# Patient Record
Sex: Male | Born: 1940
Health system: Southern US, Community
[De-identification: ages and names within clinical notes are randomized; demographics above are authoritative.]

## PROBLEM LIST (undated history)

## (undated) DIAGNOSIS — Z923 Personal history of irradiation: Secondary | ICD-10-CM

## (undated) DIAGNOSIS — N433 Hydrocele, unspecified: Secondary | ICD-10-CM

## (undated) DIAGNOSIS — I771 Stricture of artery: Secondary | ICD-10-CM

## (undated) DIAGNOSIS — IMO0001 Reserved for inherently not codable concepts without codable children: Secondary | ICD-10-CM

## (undated) DIAGNOSIS — K922 Gastrointestinal hemorrhage, unspecified: Secondary | ICD-10-CM

## (undated) DIAGNOSIS — Z9289 Personal history of other medical treatment: Secondary | ICD-10-CM

## (undated) DIAGNOSIS — A048 Other specified bacterial intestinal infections: Secondary | ICD-10-CM

## (undated) DIAGNOSIS — C61 Malignant neoplasm of prostate: Secondary | ICD-10-CM

## (undated) DIAGNOSIS — L98499 Non-pressure chronic ulcer of skin of other sites with unspecified severity: Secondary | ICD-10-CM

## (undated) HISTORY — PX: OTHER SURGICAL HISTORY: SHX169

## (undated) HISTORY — DX: Gastrointestinal hemorrhage, unspecified: K92.2

---

## 2010-05-27 ENCOUNTER — Inpatient Hospital Stay (HOSPITAL_COMMUNITY)
Admission: EM | Admit: 2010-05-27 | Discharge: 2010-06-03 | Payer: Self-pay | Source: Home / Self Care | Attending: Internal Medicine | Admitting: Internal Medicine

## 2010-05-28 ENCOUNTER — Encounter: Payer: Self-pay | Admitting: Internal Medicine

## 2010-05-31 ENCOUNTER — Encounter: Payer: Self-pay | Admitting: Internal Medicine

## 2010-06-03 ENCOUNTER — Encounter: Payer: Self-pay | Admitting: Internal Medicine

## 2010-06-04 ENCOUNTER — Encounter: Payer: Self-pay | Admitting: Nurse Practitioner

## 2010-06-04 ENCOUNTER — Encounter (INDEPENDENT_AMBULATORY_CARE_PROVIDER_SITE_OTHER): Payer: Self-pay | Admitting: *Deleted

## 2010-06-04 ENCOUNTER — Telehealth: Payer: Self-pay | Admitting: Nurse Practitioner

## 2010-06-05 ENCOUNTER — Telehealth (INDEPENDENT_AMBULATORY_CARE_PROVIDER_SITE_OTHER): Payer: Self-pay | Admitting: *Deleted

## 2010-06-06 ENCOUNTER — Encounter (INDEPENDENT_AMBULATORY_CARE_PROVIDER_SITE_OTHER): Payer: Self-pay | Admitting: *Deleted

## 2010-06-30 ENCOUNTER — Ambulatory Visit: Admit: 2010-06-30 | Payer: Self-pay | Admitting: Nurse Practitioner

## 2010-06-30 NOTE — H&P (Signed)
NAMEPARKER, WHERLEY NO.:  [id number]  MEDICAL RECORD NO.:  [id number]          PATIENT TYPE:  INP  LOCATION:  1229                         FACILITY:  [facility]  PHYSICIAN:  Hedwig Morton. Juanda Chance, MD     DATE OF BIRTH:  09-13-1940  DATE OF ADMISSION:  05/27/2010 DATE OF DISCHARGE:                             HISTORY & PHYSICAL   HISTORY:  Mr. Kauk is a 69 year old black male who presented to the Emergency Department May 27, 2010, with a chief complaint of syncope.  He described poor appetite and vomiting over the last few days.  He apparently had a syncopal episode after having a bowel movement.  Syncopal episode was preceded by nausea.  He described crampy abdominal pain and bloody stools.  Of note, the patient had never seen a medical doctor.  He does not take any medications.  In the Emergency Department the patient had a couple of episodes of hypotension with systolic blood pressures in the high 70s to low 80s.  PAST MEDICAL HISTORY:  No known past medical history.  PAST SURGICAL HISTORY:  None.  SOCIAL HISTORY:  No history of drug abuse, nonsmoker.  Does not drink alcohol.  FAMILY MEDICAL HISTORY:  Colon cancer in two brothers.  MEDICATIONS:  None.  ALLERGIES:  NO KNOWN DRUG ALLERGIES.  REVIEW OF SYSTEMS:  Positive for dizziness.  All other review of systems negative other than were noted above.  PHYSICAL EXAMINATION:  VITAL SIGNS:  Blood pressure 139/72, heart rate 98, respirations 16, O2 saturation 99% on room air. HEENT:  Head normocephalic, atraumatic.  No scleral icterus. NECK:  Supple. LUNGS:  Clear to auscultation. CARDIAC:  Tachycardic.  Jugular veins not distended. ABDOMEN:  Distended, nontender.  Active bowel sounds.  No masses felt. EXTREMITIES:  No edema. RECTAL EXAMINATION:  Maroon guaiac-positive stool.  LABORATORY STUDIES:  White count 15.1, hemoglobin 11.5, hematocrit 36.2, MCV 98.8, platelets 211, lipase 28.  Sodium 139,  potassium 3.7, BUN 38, creatinine 1.45, albumin 3.0, LFTs otherwise unremarkable.  INR 1.11. Lactic acid 5.8.  IMAGING:  CT of the head without acute abnormalities.  CT angiogram of the abdomen and pelvis:  The descending abdominal aorta demonstrates scattered atherosclerotic vascular disease.  Major branch vessels widely patent.  Small right renal cyst and small cyst of the left lobe of the liver.  There is high-grade stenosis of the right common iliac artery just distal to the bifurcation of the aorta.  Stenosis estimated at 90% and there is mild post stenotic dilatation of the right common iliac artery.  No dissection is identified.  Right hydrocele.  IMPRESSION: 1. Acute gastrointestinal bleed, lower versus upper source. 2. Family history of colorectal cancer. 3. No primary care physician so no baseline available. 4. 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 Admit for hydration. 2. Follow H and H and transfuse as indicated. 3. Upper endoscopy. 4. Eventual colonoscopy for positive family history of colorectal     cancer. 5. Further recommendations pending above.     Willette Cluster, NP   ______________________________ Hedwig Morton. Juanda Chance, MD    PG/MEDQ  D:  05/29/2010  T:  05/29/2010  Job:  [id number]  Electronically Signed by Willette Cluster NP on 06/18/2010 05:46:08 PM Electronically Signed by Lina Sar MD on 06/30/2010 02:14:42 PM

## 2010-07-01 NOTE — Discharge Summary (Signed)
NAME:  Vincent [name], Vincent [name] NO.:  [id number]  MEDICAL RECORD NO.:  [id number]          PATIENT TYPE:  INP  LOCATION:  1341                         FACILITY:  [facility]  PHYSICIAN:  Krista Som T. Russella Dar, MD, FACGDATE OF BIRTH:  Oct 14, 1940  DATE OF ADMISSION:  05/27/2010 DATE OF DISCHARGE:  06/03/2010                              DISCHARGE SUMMARY  DISPOSITION:  Home in stable condition.  DISCHARGE MEDICATIONS: 1. Pantoprazole 40 mg 1 tablet 30 minutes before breakfast, 1 tablet     30 minutes prior to dinner. 2. Ferrous sulfate 325 mg 1 tablet by mouth twice daily.   DISCHARGE DIAGNOSES: 1. Upper gastrointestinal bleed secondary to pyloric channel ulcer,     status post EGD with control of bleeding. 2. Anemia of acute blood loss, status post 3 units of packed red blood     cells. 3. No other significant past medical history.   CONSULTANTS:  None this admission.  PROCEDURES: 1. EGD with control of bleeding by Dr. Lina Sar, May 28, 2010. 2. Repeat EGD for suspected recurrent upper GI bleed, May 31, 2010.  HOSPITAL COURSE:  Mr. Costabile is a 70 year old male who presented to the emergency department May 27, 2010, with a chief complaint of syncope.  He described poor appetite and vomiting over the last few days.  He described crampy abdominal pain and bloody stools.  The patient was hypotensive on a couple occasions in the emergency department with systolic blood pressures between 70 and 80.  The patient's initial white count was 15.7, hemoglobin 11.5.  BUN elevated at 38, creatinine 1.45.  The patient was admitted to Dr. Verlee Monte Brodie's service, underwent upper endoscopy the following day which revealed multiple ulcers in the pyloric channel.  Control of bleeding of a pyloric channel ulcer was obtained with epinephrine injection.  Upon admission, a CTA of the abdomen and pelvis was obtained and was common iliac artery estimated at 90%.  There was a  right hydrocele.  On the second day of admission, the patient's hemoglobin dropped to 9, but during the EGD there was excessive bleeding, so 2 units of packed red blood cells were ordered and transfused.  Hemoglobin the following day was at 9.3 and the patient was transferred out of the ICU where he remained stable for the next 24 hours.  At 4 a.m. on May 31, 2010, the patient had a melenic stool associated with weakness.  His hemoglobin fell from 9.3 to 7.  On exam, there was a small amount of black/maroon stool in the vault.  The patient was transferred back to step-down where he remained hemodynamically stable.  There was a concern for recurrent GI bleed and the patient was taken back for an upper endoscopy.  No active or recent bleeding was found on EGD.  The pyloric channel ulcer was friable.  Two additional units of packed red blood cells were transfused and hemoglobin rose to 8.4.  The patient was transferred back to the floor the following day, where he remained stable for the remainder of this hospital admission.  On June 03, 2010, the patient felt  fine, he had no recurrent bleeding.  He had been 48 hours out from his upper GI bleed.  Mr. Cueva was discharged home in stable condition on above medications.  He would follow up in our office with myself in approximately 3 weeks. Our staff would notify him of the appointment  time and date.  I believe that an H pylori antibody was supposed to have been drawn, but I do not see any results in the computer.  We can check that at his outpatient appointment.  The patient was not on any NSAIDs prior to admission; he knows not to use any at home for the time being.     Willette Cluster, NP   ______________________________ Venita Lick. Russella Dar, MD, Clementeen Graham    PG/MEDQ  D:  06/03/2010  T:  06/03/2010  Job:  [id number]  Electronically Signed by Willette Cluster NP on 06/18/2010 05:44:37 PM Electronically Signed by Claudette Head MD FACG on  07/01/2010 16:01:09 PM

## 2010-07-03 NOTE — Procedures (Signed)
Summary: Upper Endoscopy  Patient: [name] Note: All result statuses are Final unless otherwise noted.  Tests: (1) Upper Endoscopy (EGD)   EGD Upper Endoscopy       DONE     [facility]     [address] [place], [place]  [address]           ENDOSCOPY PROCEDURE REPORT           PATIENT:  [name], Vincent  MR#:  [id number]     BIRTHDATE:  1940/11/05, 69 yrs. old  GENDER:  male           ENDOSCOPIST:  Hedwig Morton. Juanda Chance, MD     Referred by:           PROCEDURE DATE:  05/31/2010     PROCEDURE:  EGD with biopsy, 43239     ASA CLASS:  Class III     INDICATIONS:  melena Hgb 7.0, s/p UGIB from a pyloric ulcer 3 days     ago, recurrent bleed           MEDICATIONS:   Versed 4 mg, Fentanyl 50 mcg, Benadryl 50 mg     TOPICAL ANESTHETIC:  Cetacaine Spray           DESCRIPTION OF PROCEDURE:   After the risks benefits and     alternatives of the procedure were thoroughly explained, informed     consent was obtained.  The EG-[id number] ([id number]) endoscope was     introduced through the mouth and advanced to the second portion of     the duodenum, without limitations.  The instrument was slowly     withdrawn as the mucosa was fully examined.     <<PROCEDUREIMAGES>>           Multiple ulcers were found. Pyloric channel ulcer with friable     mucosa but no activr bleeding, 2 antral ulcers not bleeding With     standard forceps, a biopsy was obtained and sent to pathology. r/o     H (see image3, image4, image2, and image1).Pylori  Duodenitis was     found (see image6 and image7). deformed duodenal bulb  Otherwise     the examination was normal (see image5). no blood in the stomach     or duodenum    Retroflexed views revealed no abnormalities.    The     scope was then withdrawn from the patient and the procedure     completed.           COMPLICATIONS:  None           ENDOSCOPIC IMPRESSION:     1) Ulcers, multiple     2) Duodenitis     3) Otherwise normal examination     no  active or recent bleeding ( no blood in the stomach or     duodenum), I suspect the pyloric ulcer rebled within last 24 hours           RECOMMENDATIONS:     1) Await pathology results     see chart for orders           REPEAT EXAM:  In 0 year(s) for.           ______________________________     Hedwig Morton. Juanda Chance, MD           CC:           n.     eSIGNED:  Hedwig Morton. Brodie at 05/31/2010 11:52 AM           [place], [id number]  Note: An exclamation mark (!) indicates a result that was not dispersed into the flowsheet. Document Creation Date: 05/31/2010 11:52 AM _______________________________________________________________________  (1) Order result status: Final Collection or observation date-time: 05/31/2010 11:44 Requested date-time:  Receipt date-time:  Reported date-time:  Referring Physician:   Ordering Physician: Lina Sar [phone]) Specimen Source:  Source: Launa Grill Order Number: [id number] Lab site:

## 2010-07-03 NOTE — Letter (Signed)
Summary: New Patient letter  [facility]  [address] [place], [place] [address]   Phone: [phone]  Fax: [phone]       06/06/2010 MRN: [id number]  Vincent [name] [address] [place], [place]  [address]  [place]  Dear Vincent [name],  Welcome to the Gastroenterology Division at [facility].    You are scheduled to see Willette Cluster NP on June 30, 2010 at 9:00am on the 3rd floor at [facility], [address]  We ask that you try to arrive at our office 15 minutes prior to your appointment time to allow for check-in.  We would like you to complete the enclosed self-administered evaluation form prior to your visit and bring it with you on the day of your appointment.  We will review it with you.  Also, please bring a complete list of all your medications or, if you prefer, bring the medication bottles and we will list them.  Please bring your insurance card so that we may make a copy of it.  If your insurance requires a referral to see a specialist, please bring your referral form from your primary care physician.  Co-payments are due at the time of your visit and may be paid by cash, check or credit card.     Your office visit will consist of a consult with your physician (includes a physical exam), any laboratory testing he/she may order, scheduling of any necessary diagnostic testing (e.g. x-ray, ultrasound, CT-scan), and scheduling of a procedure (e.g. Endoscopy, Colonoscopy) if required.  Please allow enough time on your schedule to allow for any/all of these possibilities.    If you cannot keep your appointment, please call [phone] to cancel or reschedule prior to your appointment date.  This allows [place] the opportunity to schedule an appointment for another patient in need of care.  If you do not cancel or reschedule by 5 p.m. the business day prior to your appointment date, you will be charged a $50.00 late cancellation/no-show fee.    Thank you  for choosing [facility] for your medical needs.  We appreciate the opportunity to care for you.  Please visit [place] at our website  to learn more about our practice.                     Sincerely,                                                             The Gastroenterology Division

## 2010-07-03 NOTE — Letter (Signed)
Summary: Patient Notice-Endo Biopsy Results  [facility]  [address] [place], [place] [address]   Phone: [phone]  Fax: [phone]        June 03, 2010 MRN: [id number]    MILIND RAETHER [address] [place], [place]  [address]    Dear Mr. URSIN,  I am pleased to inform you that the biopsies taken during your recent endoscopic examination did not show any evidence of cancer upon pathologic examination.The biopsies from the ulcers show Helicobacter infection.We  will send You medication to treat the infection with 2 antibiotics.  Additional information/recommendations:  __No further action is needed at this time.  Please follow-up with      your primary care physician for your other healthcare needs.  _x_ Please call [phone] to schedule a return visit to review      your condition.  _x_ Continue with the treatment plan as outlined on the day of your      exam.     Please call us if you are having persistent problems or have questions about your condition that have not been fully answered at this time.  Sincerely,  Hart Carwin MD  This letter has been electronically signed by your physician.  Appended Document: Patient Notice-Endo Biopsy Results Letter mailed to patient.
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
## 2010-07-03 NOTE — Procedures (Signed)
Summary: Upper Endoscopy  Patient: Vincent [name] Note: All result statuses are Final unless otherwise noted.  Tests: (1) Upper Endoscopy (EGD)   EGD Upper Endoscopy       DONE     [facility]     [address] [place], [place]  [address]           ENDOSCOPY PROCEDURE REPORT           PATIENT:  Vincent, [name]  MR#:  [id number]     BIRTHDATE:  1940-11-01, 69 yrs. old  GENDER:  male           ENDOSCOPIST:  Hedwig Morton. Juanda Chance, MD     Referred by:           PROCEDURE DATE:  05/28/2010     PROCEDURE:  EGD for control of bleeding     ASA CLASS:  Class III     INDICATIONS:  melena, anemia, abdominal pain Hgb 11.0 dropped to     9.0     ,denies NSAIDs           MEDICATIONS:   Versed 10 mg, Fentanyl 100 mcg, Benadryl 50 mg     TOPICAL ANESTHETIC:  Cetacaine Spray           DESCRIPTION OF PROCEDURE:   After the risks benefits and     alternatives of the procedure were thoroughly explained, informed     consent was obtained.  The  endoscope was introduced through the     mouth and advanced to the second portion of the duodenum, limited     by blood and clots.   The instrument was slowly withdrawn as the     mucosa was fully examined.     <<PROCEDUREIMAGES>>           Multiple ulcers were found pyloric channel epinephrine injection     totalk of 9cc of Epi injected around the pyloric channel blindly     since the ulcer could not be visualized initially due to active     bleeding,, after adequate hemostasis was achieved, the ulcerated     pyloric channel ulcer was identified and additional Epi injected     An ulcer was found in the antrum (see image3). small 5 mm shallow     nonbleeding ulcer in the antrum  Normal esophagus without     inflammation, stricture, mass, varices, or Barrett's epithelium     (see image1).  Normal duodenal folds were noted (see image3).     Retroflexed views revealed no abnormalities.    The scope was then     withdrawn from the patient and the  procedure completed.           COMPLICATIONS:  None           ENDOSCOPIC IMPRESSION:     1) Ulcers, multiple in the pyloric channel     2) Ulcer in the antrum     3) Normal Esophagus WMO     4) Normal duodenal folds     actively bleeding pyloric channel ulcer, s?p hemostasis with     Epi, the lesion not amenable to Endoclip placement, hemostasis     achieved     RECOMMENDATIONS:     see chart for orders           REPEAT EXAM:  In 0 year(s) for.  may re-EGD if recurrent bleeding  ______________________________     Hedwig Morton. Juanda Chance, MD           CC:           n.     eSIGNED:   Hedwig Morton. Stepan Verrette at 05/28/2010 12:32 PM           [place], [id number]  Note: An exclamation mark (!) indicates a result that was not dispersed into the flowsheet. Document Creation Date: 05/28/2010 1:10 PM _______________________________________________________________________  (1) Order result status: Final Collection or observation date-time: 05/28/2010 12:20 Requested date-time:  Receipt date-time:  Reported date-time:  Referring Physician:   Ordering Physician: Lina Sar [phone]) Specimen Source:  Source: Launa Grill Order Number: [id number] Lab site:

## 2010-07-03 NOTE — Miscellaneous (Signed)
Summary: RX Ferrous Sulfate  Clinical Lists Changes  Medications: Added new medication of FERROUS SULFATE 325 (65 FE) MG TABS (FERROUS SULFATE) Take 1 tab twice daily - Signed Added new medication of OMEPRAZOLE 40 MG CPDR (OMEPRAZOLE) Take 1 tab 30 min prior to breakfast and dinner - Signed Rx of FERROUS SULFATE 325 (65 FE) MG TABS (FERROUS SULFATE) Take 1 tab twice daily;  #60 x 1;  Signed;  Entered by: Lowry Ram NCMA;  Authorized by: Willette Cluster NP;  Method used: [facility] [address] [phone]*, [address] [place], [place], [place]  [address], Ph: [phone] or [phone], Fax: [phone] Rx of OMEPRAZOLE 40 MG CPDR (OMEPRAZOLE) Take 1 tab 30 min prior to breakfast and dinner;  #60 x 1;  Signed;  Entered by: Lowry Ram NCMA;  Authorized by: Willette Cluster NP;  Method used: Telephoned to [facility] [address] [phone]*, [address] [place], [place], [place]  [address], Ph: [phone] or [phone], Fax: [phone]    Prescriptions: OMEPRAZOLE 40 MG CPDR (OMEPRAZOLE) Take 1 tab 30 min prior to breakfast and dinner  #60 x 1   Entered by:   Lowry Ram NCMA   Authorized by:   Willette Cluster NP   Signed by:   Lowry Ram NCMA on 06/04/2010   Method used:   Telephoned to ...       [facility]  [address] [phone]* (retail)       [address]       [place], [place]  [address]       Ph: [phone] or [phone]       Fax: [phone]   RxID:   [id number] FERROUS SULFATE 325 (65 FE) MG TABS (FERROUS SULFATE) Take 1 tab twice daily  #60 x 1   Entered by:   Lowry Ram NCMA   Authorized by:   Willette Cluster NP   Signed by:   Lowry Ram NCMA on 06/04/2010   Method used:   Telephoned to ...       [facility]  [address] [phone]* (retail)       [address]       [place], [place]  [address]       Ph: [phone] or [phone]       Fax: [phone]   RxID:    [id number]

## 2010-07-03 NOTE — Miscellaneous (Signed)
  Clinical Lists Changes  Observations: Added new observation of EGD: 05/31/10 (06/04/2010 9:24)
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
## 2010-08-11 LAB — CBC
HCT: 21.1 % — ABNORMAL LOW (ref 39.0–52.0)
HCT: 25.9 % — ABNORMAL LOW (ref 39.0–52.0)
HCT: 27.9 % — ABNORMAL LOW (ref 39.0–52.0)
HCT: 36.2 % — ABNORMAL LOW (ref 39.0–52.0)
Hemoglobin: 11.5 g/dL — ABNORMAL LOW (ref 13.0–17.0)
Hemoglobin: 8.6 g/dL — ABNORMAL LOW (ref 13.0–17.0)
Hemoglobin: 9.3 g/dL — ABNORMAL LOW (ref 13.0–17.0)
MCH: 29.2 pg (ref 26.0–34.0)
MCH: 29.4 pg (ref 26.0–34.0)
MCH: 30.1 pg (ref 26.0–34.0)
MCHC: 31.8 g/dL (ref 30.0–36.0)
MCHC: 33.3 g/dL (ref 30.0–36.0)
MCV: 88.4 fL (ref 78.0–100.0)
MCV: 88.9 fL (ref 78.0–100.0)
MCV: 94.8 fL (ref 78.0–100.0)
Platelets: 115 10*3/uL — ABNORMAL LOW (ref 150–400)
Platelets: 139 10*3/uL — ABNORMAL LOW (ref 150–400)
Platelets: 211 10*3/uL (ref 150–400)
RBC: 2.93 MIL/uL — ABNORMAL LOW (ref 4.22–5.81)
RBC: 3.82 MIL/uL — ABNORMAL LOW (ref 4.22–5.81)
RDW: 14.2 % (ref 11.5–15.5)
RDW: 16.2 % — ABNORMAL HIGH (ref 11.5–15.5)
RDW: 16.4 % — ABNORMAL HIGH (ref 11.5–15.5)
WBC: 12.2 10*3/uL — ABNORMAL HIGH (ref 4.0–10.5)
WBC: 12.4 10*3/uL — ABNORMAL HIGH (ref 4.0–10.5)
WBC: 15.1 10*3/uL — ABNORMAL HIGH (ref 4.0–10.5)

## 2010-08-11 LAB — URINALYSIS, ROUTINE W REFLEX MICROSCOPIC
Bilirubin Urine: NEGATIVE
Glucose, UA: NEGATIVE mg/dL
Hgb urine dipstick: NEGATIVE
Ketones, ur: NEGATIVE mg/dL
Nitrite: NEGATIVE
Protein, ur: NEGATIVE mg/dL
Specific Gravity, Urine: 1.019 (ref 1.005–1.030)
Urobilinogen, UA: 0.2 mg/dL (ref 0.0–1.0)
pH: 5.5 (ref 5.0–8.0)

## 2010-08-11 LAB — COMPREHENSIVE METABOLIC PANEL
ALT: 11 U/L (ref 0–53)
ALT: 12 U/L (ref 0–53)
ALT: 12 U/L (ref 0–53)
ALT: 13 U/L (ref 0–53)
AST: 13 U/L (ref 0–37)
AST: 19 U/L (ref 0–37)
Albumin: 2.3 g/dL — ABNORMAL LOW (ref 3.5–5.2)
Albumin: 2.8 g/dL — ABNORMAL LOW (ref 3.5–5.2)
Albumin: 3 g/dL — ABNORMAL LOW (ref 3.5–5.2)
Alkaline Phosphatase: 29 U/L — ABNORMAL LOW (ref 39–117)
Alkaline Phosphatase: 37 U/L — ABNORMAL LOW (ref 39–117)
Alkaline Phosphatase: 38 U/L — ABNORMAL LOW (ref 39–117)
Alkaline Phosphatase: 45 U/L (ref 39–117)
BUN: 16 mg/dL (ref 6–23)
BUN: 33 mg/dL — ABNORMAL HIGH (ref 6–23)
BUN: 35 mg/dL — ABNORMAL HIGH (ref 6–23)
BUN: 38 mg/dL — ABNORMAL HIGH (ref 6–23)
CO2: 26 mEq/L (ref 19–32)
CO2: 26 mEq/L (ref 19–32)
CO2: 27 mEq/L (ref 19–32)
Calcium: 7.4 mg/dL — ABNORMAL LOW (ref 8.4–10.5)
Calcium: 7.9 mg/dL — ABNORMAL LOW (ref 8.4–10.5)
Calcium: 8.2 mg/dL — ABNORMAL LOW (ref 8.4–10.5)
Chloride: 105 mEq/L (ref 96–112)
Chloride: 109 mEq/L (ref 96–112)
Chloride: 109 mEq/L (ref 96–112)
Creatinine, Ser: 0.96 mg/dL (ref 0.4–1.5)
Creatinine, Ser: 1.45 mg/dL (ref 0.4–1.5)
GFR calc Af Amer: 58 mL/min — ABNORMAL LOW (ref >60)
GFR calc Af Amer: >60 mL/min (ref >60)
GFR calc non Af Amer: 48 mL/min — ABNORMAL LOW (ref >60)
GFR calc non Af Amer: >60 mL/min (ref >60)
GFR calc non Af Amer: >60 mL/min (ref >60)
Glucose, Bld: 113 mg/dL — ABNORMAL HIGH (ref 70–99)
Glucose, Bld: 92 mg/dL (ref 70–99)
Potassium: 3.5 mEq/L (ref 3.5–5.1)
Potassium: 3.7 mEq/L (ref 3.5–5.1)
Potassium: 4.2 mEq/L (ref 3.5–5.1)
Potassium: 4.4 mEq/L (ref 3.5–5.1)
Sodium: 137 mEq/L (ref 135–145)
Sodium: 139 mEq/L (ref 135–145)
Sodium: 140 mEq/L (ref 135–145)
Sodium: 140 mEq/L (ref 135–145)
Total Bilirubin: 0.7 mg/dL (ref 0.3–1.2)
Total Bilirubin: 0.7 mg/dL (ref 0.3–1.2)
Total Bilirubin: 0.8 mg/dL (ref 0.3–1.2)
Total Protein: 4.6 g/dL — ABNORMAL LOW (ref 6.0–8.3)
Total Protein: 4.7 g/dL — ABNORMAL LOW (ref 6.0–8.3)
Total Protein: 5.2 g/dL — ABNORMAL LOW (ref 6.0–8.3)
Total Protein: 5.7 g/dL — ABNORMAL LOW (ref 6.0–8.3)

## 2010-08-11 LAB — CULTURE, BLOOD (ROUTINE X 2)
Culture  Setup Time: 201112291333
Culture  Setup Time: 201112291333
Culture: NO GROWTH
Culture: NO GROWTH

## 2010-08-11 LAB — CROSSMATCH
Antibody Screen: NEGATIVE
Unit division: 0
Unit division: 0

## 2010-08-11 LAB — POCT CARDIAC MARKERS
CKMB, poc: <1 ng/mL — ABNORMAL LOW (ref 1.0–8.0)
Myoglobin, poc: 149 ng/mL (ref 12–200)
Troponin i, poc: <0.05 ng/mL (ref 0.00–0.09)

## 2010-08-11 LAB — RENAL FUNCTION PANEL
BUN: 8 mg/dL (ref 6–23)
CO2: 27 mEq/L (ref 19–32)
Chloride: 108 mEq/L (ref 96–112)
Creatinine, Ser: 1.14 mg/dL (ref 0.4–1.5)

## 2010-08-11 LAB — TYPE AND SCREEN
ABO/RH(D): O POS
Antibody Screen: NEGATIVE
Unit division: 0
Unit division: 0
Unit division: 0

## 2010-08-11 LAB — BASIC METABOLIC PANEL
BUN: 23 mg/dL (ref 6–23)
GFR calc non Af Amer: >60 mL/min (ref >60)
Potassium: 3.9 mEq/L (ref 3.5–5.1)

## 2010-08-11 LAB — HEMOGLOBIN AND HEMATOCRIT, BLOOD
HCT: 25.4 % — ABNORMAL LOW (ref 39.0–52.0)
HCT: 26.6 % — ABNORMAL LOW (ref 39.0–52.0)
HCT: 27.6 % — ABNORMAL LOW (ref 39.0–52.0)
HCT: 27.9 % — ABNORMAL LOW (ref 39.0–52.0)
HCT: 28.3 % — ABNORMAL LOW (ref 39.0–52.0)
HCT: 28.5 % — ABNORMAL LOW (ref 39.0–52.0)
Hemoglobin: 9 g/dL — ABNORMAL LOW (ref 13.0–17.0)
Hemoglobin: 9.1 g/dL — ABNORMAL LOW (ref 13.0–17.0)
Hemoglobin: 9.3 g/dL — ABNORMAL LOW (ref 13.0–17.0)
Hemoglobin: 9.4 g/dL — ABNORMAL LOW (ref 13.0–17.0)
Hemoglobin: 9.6 g/dL — ABNORMAL LOW (ref 13.0–17.0)

## 2010-08-11 LAB — PROTIME-INR
INR: 1.11 (ref 0.00–1.49)
Prothrombin Time: 14.5 seconds (ref 11.6–15.2)

## 2010-08-11 LAB — LACTIC ACID, PLASMA: Lactic Acid, Venous: 5.8 mmol/L — ABNORMAL HIGH (ref 0.5–2.2)

## 2010-08-11 LAB — DIFFERENTIAL
Basophils Absolute: 0 10*3/uL (ref 0.0–0.1)
Basophils Absolute: 0 10*3/uL (ref 0.0–0.1)
Basophils Relative: 0 % (ref 0–1)
Eosinophils Absolute: 0 10*3/uL (ref 0.0–0.7)
Eosinophils Relative: 0 % (ref 0–5)
Lymphocytes Relative: 10 % — ABNORMAL LOW (ref 12–46)
Lymphocytes Relative: 18 % (ref 12–46)
Lymphs Abs: 1.5 10*3/uL (ref 0.7–4.0)
Lymphs Abs: 1.5 10*3/uL (ref 0.7–4.0)
Monocytes Absolute: 0.6 10*3/uL (ref 0.1–1.0)
Monocytes Absolute: 1.1 10*3/uL — ABNORMAL HIGH (ref 0.1–1.0)
Monocytes Relative: 7 % (ref 3–12)
Monocytes Relative: 7 % (ref 3–12)
Neutro Abs: 12.5 10*3/uL — ABNORMAL HIGH (ref 1.7–7.7)
Neutro Abs: 6 10*3/uL (ref 1.7–7.7)
Neutrophils Relative %: 83 % — ABNORMAL HIGH (ref 43–77)

## 2010-08-11 LAB — POCT I-STAT, CHEM 8
BUN: 38 mg/dL — ABNORMAL HIGH (ref 6–23)
Calcium, Ion: 1.11 mmol/L — ABNORMAL LOW (ref 1.12–1.32)
Chloride: 104 mEq/L (ref 96–112)
Creatinine, Ser: 1.5 mg/dL (ref 0.4–1.5)
Glucose, Bld: 116 mg/dL — ABNORMAL HIGH (ref 70–99)
HCT: 38 % — ABNORMAL LOW (ref 39.0–52.0)
Hemoglobin: 12.9 g/dL — ABNORMAL LOW (ref 13.0–17.0)
Potassium: 3.8 mEq/L (ref 3.5–5.1)
Sodium: 140 mEq/L (ref 135–145)
TCO2: 26 mmol/L (ref 0–100)

## 2010-08-11 LAB — HEMOCCULT GUIAC POC 1CARD (OFFICE): Fecal Occult Bld: POSITIVE

## 2010-08-11 LAB — ABO/RH: ABO/RH(D): O POS

## 2010-08-11 LAB — URINE CULTURE
Colony Count: 30000
Culture  Setup Time: 201112280408

## 2010-08-11 LAB — BLOOD GAS, ARTERIAL
Acid-base deficit: 2.2 mmol/L — ABNORMAL HIGH (ref 0.0–2.0)
Bicarbonate: 21.4 mEq/L (ref 20.0–24.0)
Patient temperature: 98.6
TCO2: 19.8 mmol/L (ref 0–100)
pCO2 arterial: 34.4 mmHg — ABNORMAL LOW (ref 35.0–45.0)
pH, Arterial: 7.411 (ref 7.350–7.450)

## 2010-08-11 LAB — LIPASE, BLOOD: Lipase: 28 U/L (ref 11–59)

## 2010-08-11 LAB — OCCULT BLOOD, POC DEVICE: Fecal Occult Bld: POSITIVE

## 2010-08-11 LAB — HEPATIC FUNCTION PANEL
ALT: 12 U/L (ref 0–53)
Alkaline Phosphatase: 35 U/L — ABNORMAL LOW (ref 39–117)
Bilirubin, Direct: 0.2 mg/dL (ref 0.0–0.3)
Indirect Bilirubin: 1.2 mg/dL — ABNORMAL HIGH (ref 0.3–0.9)

## 2010-08-11 LAB — PREPARE RBC (CROSSMATCH)

## 2011-11-20 IMAGING — CT CT HEAD W/O CM
3 of 4 series · 16 of 40 positions shown, 19 images · non-contrast
Comparison: None.

CT HEAD

CLINICAL DATA: Headache and neck pain following a syncopal episode
resulting in a fall.

CT HEAD WITHOUT CONTRAST
CT CERVICAL SPINE WITHOUT CONTRAST
TECHNIQUE: Multidetector CT imaging of the head and cervical spine
was performed following the standard protocol without intravenous
contrast.  Multiplanar CT image reconstructions of the cervical
spine were also generated.

[Series 3: head_seq 4.5 h37s st · axial · 0.43mm/px · z∈[-4,+41]mm · 2 of 32 slices shown]
[im 11/32  brain]
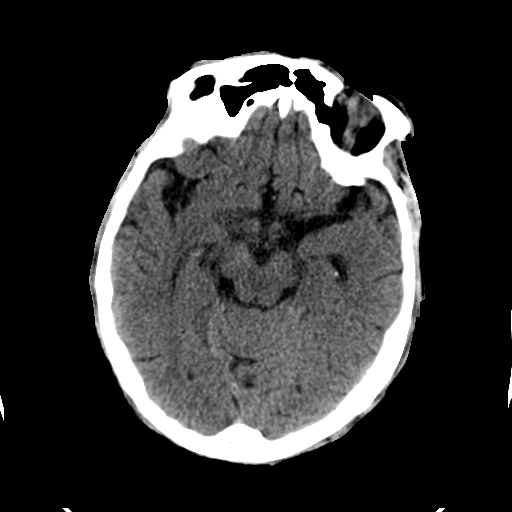
[im 21/32  brain]
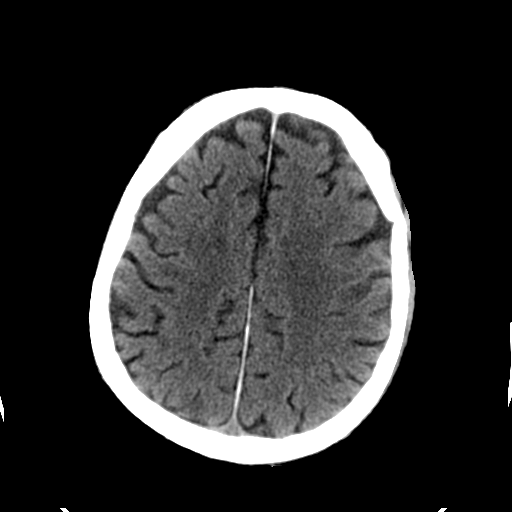

[Series 602: <mpr thick range> · coronal · 0.34mm/px · 3 of 49 slices shown]
[im 17/49  brain]
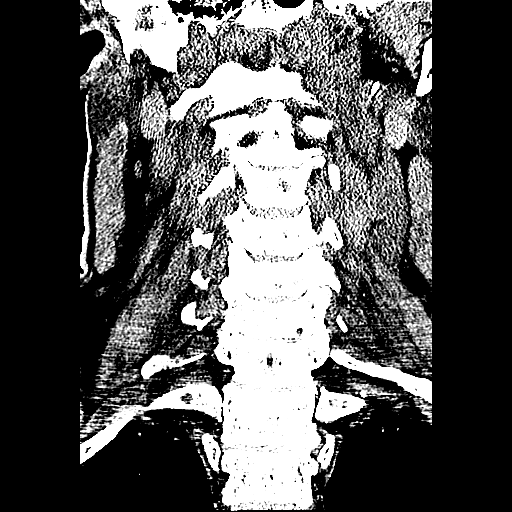
[im 22/49  brain]
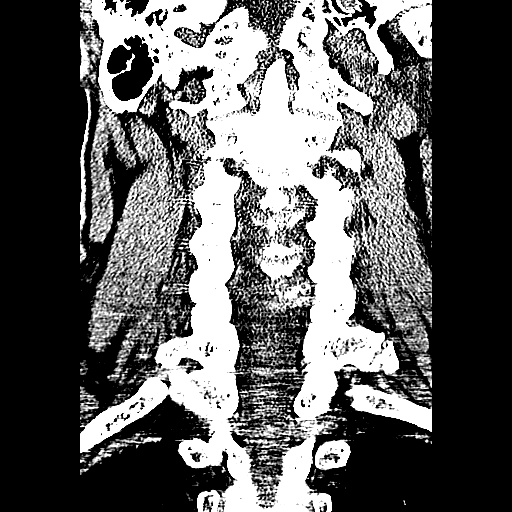
[im 27/49  brain]
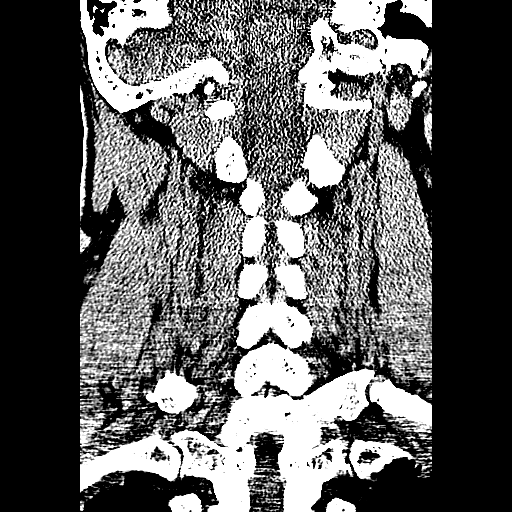

[Series 603: <mpr thick range(1)> · axial · 0.34mm/px · z∈[-228,-89]mm · 11 of 91 slices shown, 14 images]
[im 8/91  brain]
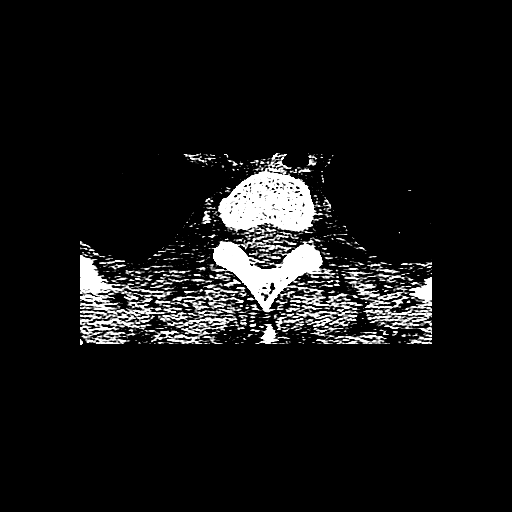
[im 8/91  bone]
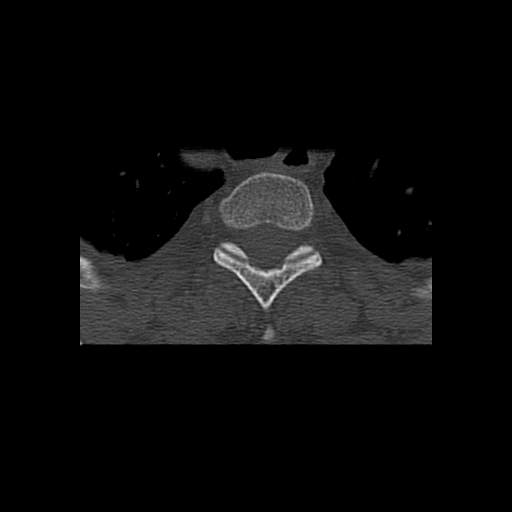
[im 16/91  brain]
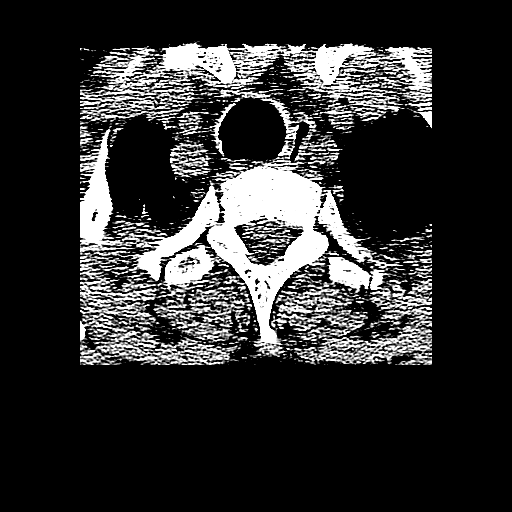
[im 23/91  brain]
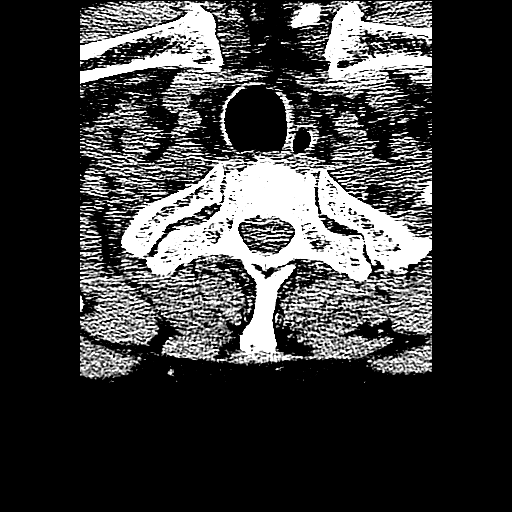
[im 31/91  brain]
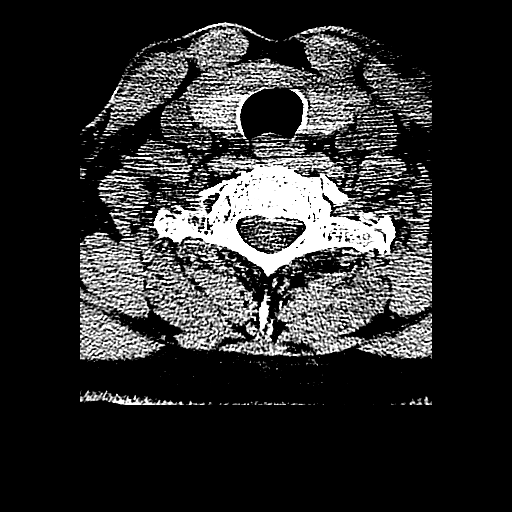
[im 38/91  brain]
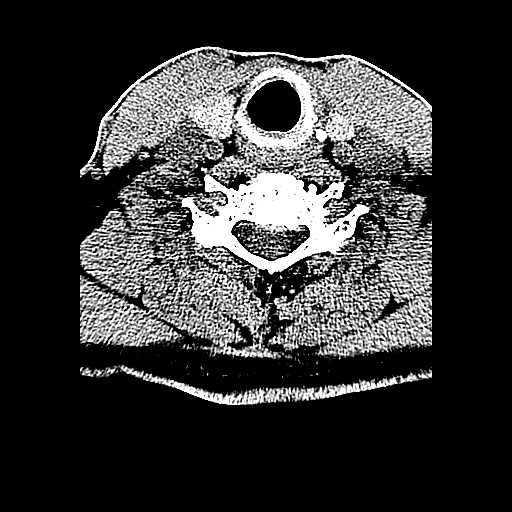
[im 38/91  bone]
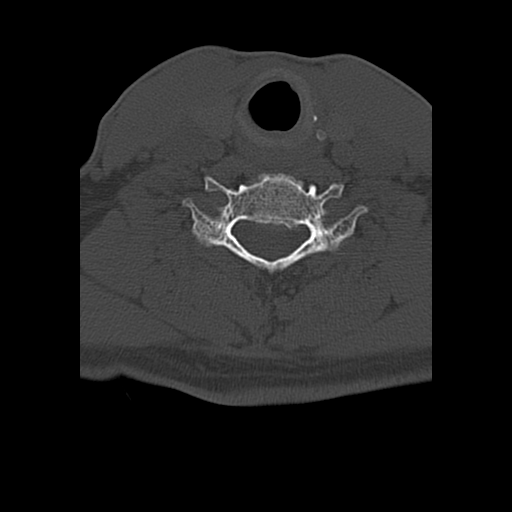
[im 46/91  brain]
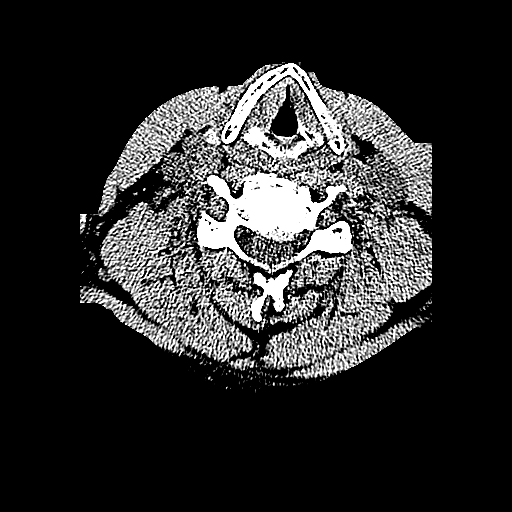
[im 53/91  brain]
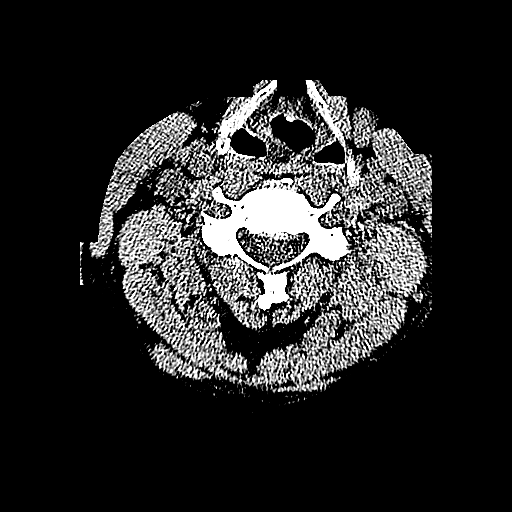
[im 61/91  brain]
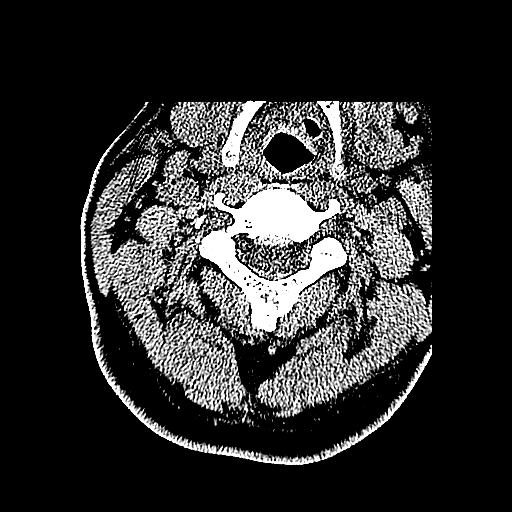
[im 68/91  brain]
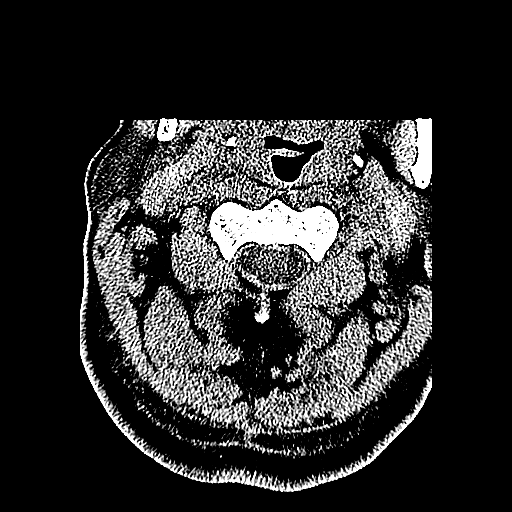
[im 68/91  bone]
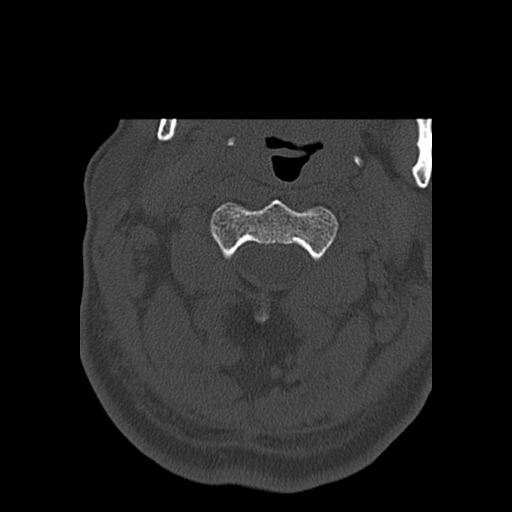
[im 76/91  brain]
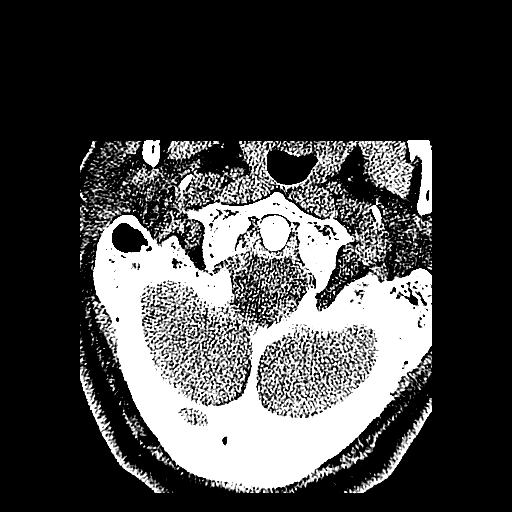
[im 83/91  brain]
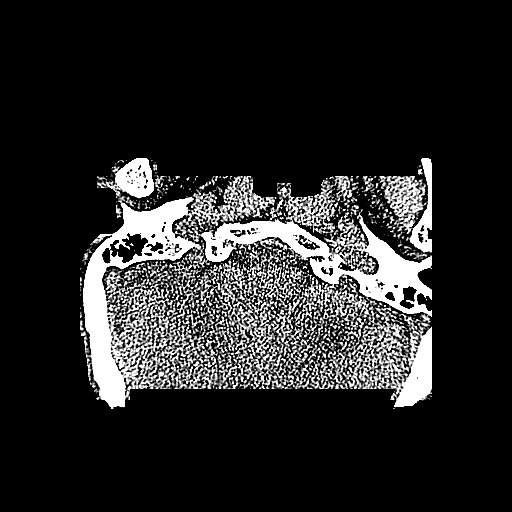

[16 of 40 positions shown; findings below may reference images not displayed]

FINDINGS: Mildly enlarged ventricles and subarachnoid spaces.  Mild
patchy white matter low density in both cerebral hemispheres.
Normal size and position of the ventricles.  No skull fracture,
intracranial hemorrhage, paranasal sinus air-fluid levels, mass
lesions or CT evidence of acute infarction.
IMPRESSION: Mild atrophy and chronic small vessel white matter ischemic
changes.  No acute abnormality.

CT CERVICAL SPINE
FINDINGS: Mild bullous changes at the lung apices.  Mild reversal
of the normal cervical lordosis.  Multilevel degenerative changes.
No prevertebral soft tissue swelling, fractures or subluxations.
IMPRESSION: 1.  No fracture or subluxation.
2.  Multilevel degenerative changes.
3.  Mild reversal of the normal cervical lordosis.
4.  COPD.

## 2012-07-31 ENCOUNTER — Encounter (HOSPITAL_COMMUNITY)
Admission: EM | Disposition: A | Discharge status: Home / Self Care | Payer: Self-pay | Source: Home / Self Care | Attending: Internal Medicine

## 2012-07-31 ENCOUNTER — Inpatient Hospital Stay (HOSPITAL_COMMUNITY)
Admission: EM | Admit: 2012-07-31 | Discharge: 2012-08-03 | DRG: 378 | Disposition: A | Discharge status: Home / Self Care | Payer: Medicare Other | Attending: Internal Medicine | Admitting: Internal Medicine

## 2012-07-31 ENCOUNTER — Encounter (HOSPITAL_COMMUNITY): Payer: Self-pay | Admitting: Emergency Medicine

## 2012-07-31 DIAGNOSIS — R55 Syncope and collapse: Secondary | ICD-10-CM | POA: Diagnosis present

## 2012-07-31 DIAGNOSIS — K254 Chronic or unspecified gastric ulcer with hemorrhage: Principal | ICD-10-CM | POA: Diagnosis present

## 2012-07-31 DIAGNOSIS — F17200 Nicotine dependence, unspecified, uncomplicated: Secondary | ICD-10-CM | POA: Diagnosis present

## 2012-07-31 DIAGNOSIS — K259 Gastric ulcer, unspecified as acute or chronic, without hemorrhage or perforation: Secondary | ICD-10-CM | POA: Diagnosis present

## 2012-07-31 DIAGNOSIS — Z79899 Other long term (current) drug therapy: Secondary | ICD-10-CM

## 2012-07-31 DIAGNOSIS — K2941 Chronic atrophic gastritis with bleeding: Secondary | ICD-10-CM | POA: Diagnosis present

## 2012-07-31 DIAGNOSIS — A048 Other specified bacterial intestinal infections: Secondary | ICD-10-CM | POA: Diagnosis present

## 2012-07-31 DIAGNOSIS — D62 Acute posthemorrhagic anemia: Secondary | ICD-10-CM | POA: Diagnosis present

## 2012-07-31 DIAGNOSIS — K922 Gastrointestinal hemorrhage, unspecified: Secondary | ICD-10-CM | POA: Diagnosis present

## 2012-07-31 DIAGNOSIS — R195 Other fecal abnormalities: Secondary | ICD-10-CM

## 2012-07-31 DIAGNOSIS — Z7982 Long term (current) use of aspirin: Secondary | ICD-10-CM

## 2012-07-31 HISTORY — DX: Hydrocele, unspecified: N43.3

## 2012-07-31 HISTORY — DX: Personal history of other medical treatment: Z92.89

## 2012-07-31 HISTORY — DX: Reserved for inherently not codable concepts without codable children: IMO0001

## 2012-07-31 HISTORY — PX: ESOPHAGOGASTRODUODENOSCOPY: SHX5428

## 2012-07-31 HISTORY — DX: Stricture of artery: I77.1

## 2012-07-31 HISTORY — DX: Non-pressure chronic ulcer of skin of other sites with unspecified severity: L98.499

## 2012-07-31 HISTORY — DX: Other specified bacterial intestinal infections: A04.8

## 2012-07-31 LAB — PROTIME-INR: INR: 0.97 (ref 0.00–1.49)

## 2012-07-31 LAB — HEMATOCRIT: HCT: 32 % — ABNORMAL LOW (ref 39.0–52.0)

## 2012-07-31 LAB — CBC WITH DIFFERENTIAL/PLATELET
Basophils Absolute: 0 10*3/uL (ref 0.0–0.1)
Basophils Relative: 0 % (ref 0–1)
Eosinophils Relative: 1 % (ref 0–5)
Lymphocytes Relative: 25 % (ref 12–46)
MCHC: 33.3 g/dL (ref 30.0–36.0)
MCV: 85.3 fL (ref 78.0–100.0)
Neutro Abs: 5 10*3/uL (ref 1.7–7.7)
Platelets: 240 10*3/uL (ref 150–400)
RDW: 13.7 % (ref 11.5–15.5)
WBC: 7.6 10*3/uL (ref 4.0–10.5)

## 2012-07-31 LAB — COMPREHENSIVE METABOLIC PANEL
ALT: 15 U/L (ref 0–53)
AST: 15 U/L (ref 0–37)
Albumin: 3.3 g/dL — ABNORMAL LOW (ref 3.5–5.2)
CO2: 25 mEq/L (ref 19–32)
Calcium: 8.7 mg/dL (ref 8.4–10.5)
Creatinine, Ser: 0.88 mg/dL (ref 0.50–1.35)
GFR calc non Af Amer: 83 mL/min — ABNORMAL LOW (ref >90)
Sodium: 135 mEq/L (ref 135–145)
Total Protein: 7.3 g/dL (ref 6.0–8.3)

## 2012-07-31 LAB — PREPARE RBC (CROSSMATCH)

## 2012-07-31 LAB — HEMOGLOBIN: Hemoglobin: 10.8 g/dL — ABNORMAL LOW (ref 13.0–17.0)

## 2012-07-31 LAB — LIPASE, BLOOD: Lipase: 29 U/L (ref 11–59)

## 2012-07-31 SURGERY — EGD (ESOPHAGOGASTRODUODENOSCOPY)
Anesthesia: Moderate Sedation

## 2012-07-31 MED ORDER — ALUM & MAG HYDROXIDE-SIMETH 200-200-20 MG/5ML PO SUSP: 30.0000 mL | Freq: Four times a day (QID) | ORAL | Status: DC | PRN

## 2012-07-31 MED ORDER — SODIUM CHLORIDE 0.9 % IV BOLUS (SEPSIS)
1000.0000 mL | Freq: Once | INTRAVENOUS | Status: AC
  Administered 2012-07-31: 1000 mL via INTRAVENOUS

## 2012-07-31 MED ORDER — SODIUM CHLORIDE 0.9 % IJ SOLN
3.0000 mL | Freq: Two times a day (BID) | INTRAMUSCULAR | Status: DC
  Administered 2012-07-31: 3 mL via INTRAVENOUS
  Administered 2012-08-01: 10 mL via INTRAVENOUS
  Administered 2012-08-01 – 2012-08-03 (×4): 3 mL via INTRAVENOUS

## 2012-07-31 MED ORDER — EPINEPHRINE HCL 1 MG/ML IJ SOLN
INTRAMUSCULAR | Status: DC | PRN
  Administered 2012-07-31: 3 mg via SUBCUTANEOUS

## 2012-07-31 MED ORDER — SODIUM CHLORIDE 0.9 % IV SOLN: INTRAVENOUS | Status: DC

## 2012-07-31 MED ORDER — ACETAMINOPHEN 325 MG PO TABS: 650.0000 mg | ORAL_TABLET | Freq: Four times a day (QID) | ORAL | Status: DC | PRN

## 2012-07-31 MED ORDER — SODIUM CHLORIDE 0.9 % IV SOLN
80.0000 mg | Freq: Once | INTRAVENOUS | Status: AC
  Administered 2012-07-31: 80 mg via INTRAVENOUS
  Filled 2012-07-31: qty 80

## 2012-07-31 MED ORDER — MIDAZOLAM HCL 10 MG/2ML IJ SOLN
INTRAMUSCULAR | Status: DC | PRN
  Administered 2012-07-31 (×3): 2 mg via INTRAVENOUS

## 2012-07-31 MED ORDER — FENTANYL CITRATE 0.05 MG/ML IJ SOLN
INTRAMUSCULAR | Status: DC | PRN
  Administered 2012-07-31 (×3): 25 ug via INTRAVENOUS

## 2012-07-31 MED ORDER — PANTOPRAZOLE SODIUM 40 MG IV SOLR
8.0000 mg/h | INTRAVENOUS | Status: DC
  Administered 2012-07-31 – 2012-08-01 (×3): 8 mg/h via INTRAVENOUS
  Filled 2012-07-31 (×8): qty 80

## 2012-07-31 MED ORDER — ACETAMINOPHEN 650 MG RE SUPP: 650.0000 mg | Freq: Four times a day (QID) | RECTAL | Status: DC | PRN

## 2012-07-31 MED ORDER — SODIUM CHLORIDE 0.9 % IV SOLN
INTRAVENOUS | Status: AC
  Administered 2012-07-31: 19:00:00 via INTRAVENOUS

## 2012-07-31 MED ORDER — BUTAMBEN-TETRACAINE-BENZOCAINE 2-2-14 % EX AERO
INHALATION_SPRAY | CUTANEOUS | Status: DC | PRN
  Administered 2012-07-31: 2 via TOPICAL

## 2012-07-31 MED ORDER — ONDANSETRON HCL 4 MG/2ML IJ SOLN: 4.0000 mg | Freq: Three times a day (TID) | INTRAMUSCULAR | Status: AC | PRN

## 2012-07-31 NOTE — ED Notes (Addendum)
Pt states that he has had abdominal pain and has been passing blood in his stool. Also states that he passed out after he stood up from having a BM yesterday. Hx of ulcers.

## 2012-07-31 NOTE — Op Note (Signed)
[facility] [address] [place], [address]   ENDOSCOPY PROCEDURE REPORT  PATIENT: Vincent [name], Vincent [name]  MR#: [id number] BIRTHDATE: December 08, 1940 , 71  yrs. old GENDER: Male ENDOSCOPIST: Hart Carwin, MD REFERRED BY:  Dr Rama PROCEDURE DATE:  07/31/2012 PROCEDURE:  EGD w/ biopsy ASA CLASS:     Class III INDICATIONS:  Vomiting.   hx bleeding pyloric ulcer 05/2010, came in ED with syncopal episode, Hgb 13.0. MEDICATIONS: These medications were titrated to patient response per physician's verbal order, Versed 6 mg IV, and Fentanyl 75 mcg IV TOPICAL ANESTHETIC: Cetacaine Spray  DESCRIPTION OF PROCEDURE: After the risks benefits and alternatives of the procedure were thoroughly explained, informed consent was obtained.  The Pentax Gastroscope [id number] endoscope was introduced through the mouth and advanced to the second portion of the duodenum. Without limitations.  The instrument was slowly withdrawn as the mucosa was fully examined.        ESOPHAGUS: The mucosa of the esophagus appeared normal.  Stomach: there was large amount of fresh and partially clotted blood along the greater curvature of the stomach,,prepyloric antrum  had a large adherent blood clop extending into the pyloric channel,there was no active vleeding but the clot appeared rather fresh, We injected 1cc of Epinephrine at the edges of the clot in 3 separate locations,Pyloric channel was edematous and had erosions, there was no obstruction  DUODENUM:  the bulb and descending duodenul was normal. Biopsies were taken from gastric antrum to r/o H.Pylori Retroflexed views revealed no abnormalities.     The scope was then withdrawn from the patient and the procedure completed.  COMPLICATIONS: There were no complications. ENDOSCOPIC IMPRESSION:  The mucosa of the esophagus appeared normal Large amount of recent blood pooled in the stomach- 600cc's Adherent blood clot at the pyloric channel, not  dislodged, injected with Epinephrine 3cc, no active bleeding observed, I asume there is an ulcer underneath the blood clot but the clot was covering  most of pyloric opening Biopsies for H.Pylori  RECOMMENDATIONS: await biopsies for H.Pylori careful observation for rebleeding, consider step-down unit PPI drip Will be reendoscoped  either before discharge or in 4-6 weeks to asure healing  REPEAT EXAM: timing not determined  eSigned:  Hart Carwin, MD 07/31/2012 8:56 PM   CC:  PATIENT NAME:  [name], Police MR#: [id number]

## 2012-07-31 NOTE — ED Provider Notes (Signed)
History     CSN: [id number]  Arrival date & time 07/31/12  1558   First MD Initiated Contact with Patient 07/31/12 1622      Chief Complaint  Patient presents with  . Rectal Bleeding  . Loss of Consciousness  . Abdominal Pain    (Consider location/radiation/quality/duration/timing/severity/associated sxs/prior treatment) Patient is a 72 y.o. male presenting with hematochezia, syncope, and abdominal pain. The history is provided by the patient.  Rectal Bleeding  Associated symptoms include abdominal pain and nausea. Pertinent negatives include no fever, no diarrhea, no vomiting, no chest pain, no headaches, no coughing and no rash.  Loss of Consciousness  Associated symptoms include abdominal pain and nausea. Pertinent negatives include back pain, chest pain, confusion, fever, headaches, palpitations, vomiting and weakness.  Abdominal Pain Associated symptoms: hematochezia and nausea   Associated symptoms: no chest pain, no chills, no constipation, no cough, no diarrhea, no fever, no shortness of breath and no vomiting   pt c/o epigastric pain in past couple days, dull, crampy, at times worse after eating. Non radiating. Also noted his stools have been very dark in past 1-2 days. Last pm was feeling nauseated, stood up from stool, felt more lightheaded, momentarily 'fainted'. Denies injury. No associated cp or discomfort. No palpitations. No sob. Denies head injury or headache. No neck or back pain. Pt does note hx gib due to gastric ulcer. States has been off all meds/gi meds for several months.  Does take an occasional aspirin for aches/pains, denies other nsaid use.     Past Medical History  Diagnosis Date  . Ulcer disease     No past surgical history on file.  No family history on file.  History  Substance Use Topics  . Smoking status: Current Every Day Smoker -- 0.30 packs/day  . Smokeless tobacco: Not on file  . Alcohol Use: No      Review of Systems   Constitutional: Negative for fever and chills.  HENT: Negative for neck pain.   Eyes: Negative for redness.  Respiratory: Negative for cough and shortness of breath.   Cardiovascular: Positive for syncope. Negative for chest pain and palpitations.  Gastrointestinal: Positive for nausea, abdominal pain, blood in stool and hematochezia. Negative for vomiting, diarrhea and constipation.  Genitourinary: Negative for flank pain.  Musculoskeletal: Negative for back pain.  Skin: Negative for rash.  Neurological: Negative for weakness, numbness and headaches.  Hematological: Does not bruise/bleed easily.  Psychiatric/Behavioral: Negative for confusion.    Allergies  Review of patient's allergies indicates no known allergies.  Home Medications  No current outpatient prescriptions on file.  BP 124/69  Pulse 89  Temp(Src) 98.6 F (37 C) (Oral)  SpO2 99%  Physical Exam  Nursing note and vitals reviewed. Constitutional: He is oriented to person, place, and time. He appears well-developed and well-nourished. No distress.  HENT:  Head: Atraumatic.  Mouth/Throat: Oropharynx is clear and moist.  Eyes: Conjunctivae are normal. No scleral icterus.  Neck: Neck supple. No tracheal deviation present.  Cardiovascular: Normal rate, regular rhythm, normal heart sounds and intact distal pulses.   Pulmonary/Chest: Effort normal and breath sounds normal. No accessory muscle usage. No respiratory distress.  Abdominal: Soft. Bowel sounds are normal. He exhibits no distension and no mass. There is no tenderness. There is no rebound and no guarding.  Genitourinary:  Very dark maroon heme pos stool  Musculoskeletal: Normal range of motion. He exhibits no edema and no tenderness.  CTLS spine, non tender, aligned, no step off.  Neurological: He is alert and oriented to person, place, and time.  Skin: Skin is warm and dry.  Psychiatric: He has a normal mood and affect.    ED Course  Procedures  (including critical care time)  Results for orders placed during the hospital encounter of 07/31/12  CBC WITH DIFFERENTIAL      Result Value Range   WBC 7.6  4.0 - 10.5 K/uL   RBC 4.76  4.22 - 5.81 MIL/uL   Hemoglobin 13.5  13.0 - 17.0 g/dL   HCT 16.1  09.6 - 04.5 %   MCV 85.3  78.0 - 100.0 fL   MCH 28.4  26.0 - 34.0 pg   MCHC 33.3  30.0 - 36.0 g/dL   RDW 40.9  81.1 - 91.4 %   Platelets 240  150 - 400 K/uL   Neutrophils Relative 65  43 - 77 %   Neutro Abs 5.0  1.7 - 7.7 K/uL   Lymphocytes Relative 25  12 - 46 %   Lymphs Abs 1.9  0.7 - 4.0 K/uL   Monocytes Relative 8  3 - 12 %   Monocytes Absolute 0.6  0.1 - 1.0 K/uL   Eosinophils Relative 1  0 - 5 %   Eosinophils Absolute 0.1  0.0 - 0.7 K/uL   Basophils Relative 0  0 - 1 %   Basophils Absolute 0.0  0.0 - 0.1 K/uL  COMPREHENSIVE METABOLIC PANEL      Result Value Range   Sodium 135  135 - 145 mEq/L   Potassium 3.5  3.5 - 5.1 mEq/L   Chloride 99  96 - 112 mEq/L   CO2 25  19 - 32 mEq/L   Glucose, Bld 121 (*) 70 - 99 mg/dL   BUN 18  6 - 23 mg/dL   Creatinine, Ser 7.82  0.50 - 1.35 mg/dL   Calcium 8.7  8.4 - 95.6 mg/dL   Total Protein 7.3  6.0 - 8.3 g/dL   Albumin 3.3 (*) 3.5 - 5.2 g/dL   AST 15  0 - 37 U/L   ALT 15  0 - 53 U/L   Alkaline Phosphatase 73  39 - 117 U/L   Total Bilirubin 0.5  0.3 - 1.2 mg/dL   GFR calc non Af Amer 83 (*) >90 mL/min   GFR calc Af Amer >90  >90 mL/min  PROTIME-INR      Result Value Range   Prothrombin Time 12.8  11.6 - 15.2 seconds   INR 0.97  0.00 - 1.49  LIPASE, BLOOD      Result Value Range   Lipase 29  11 - 59 U/L  APTT      Result Value Range   aPTT 29  24 - 37 seconds  OCCULT BLOOD, POC DEVICE      Result Value Range   Fecal Occult Bld POSITIVE (*) NEGATIVE  TYPE AND SCREEN      Result Value Range   ABO/RH(D) O POS     Antibody Screen NEG     Sample Expiration 08/03/2012         MDM  Iv ns bolus. Labs. protonix iv.  Reviewed nursing notes and prior charts for additional  history.     Date: 07/31/2012  Rate: 87  Rhythm: normal sinus rhythm  QRS Axis: normal  Intervals: normal  ST/T Wave abnormalities: normal  Conduction Disutrbances:none  Narrative Interpretation:   Old EKG Reviewed: unchanged  Recheck abd soft nt.  Given syncope, heme  positive black/dark maroon stools, hx gib due to bleeding ulcer, will admit/obs.  [facility] called.           Suzi Roots, MD 07/31/12 [phone]

## 2012-07-31 NOTE — H&P (Addendum)
[facility] History and Physical  Vincent [name] XBJ:[id number] DOB: [birth year] DOA: 07/31/2012  Referring physician: Dr. Cathren Laine PCP: No primary provider on file.  Gastroenterologist: Dr. Lina Sar  Chief Complaint: Syncope, hematochezia   History of Present Illness: Sahith Nurse is an 72 y.o. male with a PMH of syncope and GIB secondary to multiple ulcers in the pyloric channel status post EGD 05/2010.  Control of bleeding of the pyloric channel ulcer was obtained with epinephrine injection at that time.  He was discharged home on PPI therapy at that time.  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  The patient states that last night he was feeling dizzy and feinted with loss of consciousess (unsure of how long he was passed out).  The patient had frankly bloody stools prior to this episode (maroon colored).  No further BMs.  He also had an episode of N/V last night, emesis was clear in appearance.  No coffee ground appearing emesis.  Also reports bloating and abdominal discomfort.  The patient does take an occasional aspirin, but does not take aspirin regularly.  No use of NSAIDS.  The patient denies associated shortness of breath, chest pain, but has some chest congestion and cough.  He is not taking any acid suppressing medication.     Review of Systems: Constitutional: No fever, no chills;  Appetite diminished; + mild weight loss, no weight gain.  HEENT: No blurry vision, no diplopia, no pharyngitis, no dysphagia CV: No chest pain, no palpitations.  Resp: No SOB, + cough. GI: + nausea and vomiting, no diarrhea, +melena and hematochezia.  GU: + frequency, hesistency and dysuria, no hematuria.  MSK: no myalgias, no arthralgias.  Neuro:  No headache, no focal neurological deficits, no history of seizures.  Psych: No depression, no anxiety.  Endo: No thyroid disease, no DM, no heat intolerance, no cold intolerance, no polyuria, no polydipsia  Skin: No  rashes, no skin lesions.  Heme: No easy bruising, no history of blood diseases.  Past Medical History Past Medical History  Diagnosis Date  . Ulcer disease   . Transfusion history     Secondary to GIB 05/2010  . Right hydrocele   . Iliac artery stenosis, right     90% by CTA 05/27/2010  . H. pylori infection      Past Surgical History No past surgical history on file.  Denies any history of surgery.   Social History: History   Social History  . Marital Status: Single    Spouse Name: N/A    Number of Children: 3  . Years of Education: N/A   Occupational History  . Retired, Medical sales representative.    Social History Main Topics  . Smoking status: Current Every Day Smoker -- 0.30 packs/day  . Smokeless tobacco: Not on file  . Alcohol Use: No  . Drug Use: No  . Sexually Active: Not on file   Other Topics Concern  . Not on file   Social History Narrative   Single.  Lives alone.  Independent of ADLs and with ambulation.    Family History:  Family History  Problem Relation Age of Onset  . Cancer Brother     Colon  . Cancer Brother     Colon  . Cancer Mother     Breast    Allergies: Review of patient's allergies indicates no known allergies.  Meds: Prior to Admission medications   Medication Sig Start Date End Date Taking? Authorizing Provider  aspirin  EC 81 MG tablet Take 81 mg by mouth daily.   Yes Historical Provider, MD  Pseudoeph-Doxylamine-DM-APAP (NYQUIL) 60-7.10-28-998 MG/30ML LIQD Take 30 mLs by mouth at bedtime as needed (cold/flu symptoms).   Yes Historical Provider, MD  psyllium (HYDROCIL/METAMUCIL) 95 % PACK Take 1 packet by mouth daily.   Yes Historical Provider, MD    Physical Exam: Filed Vitals:   07/31/12 1604  BP: 124/69  Pulse: 89  Temp: 98.6 F (37 C)  TempSrc: Oral  SpO2: 99%     Physical Exam: Blood pressure 124/69, pulse 89, temperature 98.6 F (37 C), temperature source Oral, SpO2 99.00%. Gen: No acute distress. Head:  Normocephalic, atraumatic. Eyes: PERRL, EOMI, sclerae nonicteric. There is haziness to his lenses bilaterally. Mouth: Oropharynx clear with moist mucous membranes. Poor dentition with missing teeth and multiple dental caries. Neck: Supple, no thyromegaly, no lymphadenopathy, no jugular venous distention. Chest: Lungs are clear to auscultation bilaterally with slightly diminished breath sounds in the bases. CV: Heart sounds are regular, without murmurs, rubs, or gallops. Abdomen: Soft, nontender, nondistended with normal active bowel sounds. Extremities: Extremities are with mild clubbing but no edema or cyanosis. Skin: Warm and dry. Neuro: Alert and oriented times 3; cranial nerves II through XII grossly intact. Psych: Mood and affect normal.  Labs on Admission:  Basic Metabolic Panel:  Recent Labs Lab 07/31/12 1634  NA 135  K 3.5  CL 99  CO2 25  GLUCOSE 121*  BUN 18  CREATININE 0.88  CALCIUM 8.7   Liver Function Tests:  Recent Labs Lab 07/31/12 1634  AST 15  ALT 15  ALKPHOS 73  BILITOT 0.5  PROT 7.3  ALBUMIN 3.3*    Recent Labs Lab 07/31/12 1634  LIPASE 29   CBC:  Recent Labs Lab 07/31/12 1634  WBC 7.6  NEUTROABS 5.0  HGB 13.5  HCT 40.6  MCV 85.3  PLT 240    Radiological Exams on Admission: No results found.  EKG: Independently reviewed. Normal sinus rhythm at 87 beats per minute. No ST or T wave abnormalities appreciated.  Assessment/Plan Active Problems:   Syncope secondary to GI bleed -We will admit the patient to a telemetry floor and monitor him closely with every 6 hour hemoglobin and hematocrit checks. -Will place him on Protonix by continuous infusion. -I have spoken with Lina Sar, who will see the patient in consultation and take emergently for upper endoscopy tonight. -Will place 2 large-bore IVs and transfuse for a significant drop in his hemoglobin.  Code Status: Full. Family Communication: Daughter Knute Neu [phone]. Disposition Plan: Home when stable.  Time spent: 1 hour.  RAMA,CHRISTINA [facility] Pager [phone]  If 7PM-7AM, please contact night-coverage [email or web address] Password [facility] 07/31/2012, 6:51 PM

## 2012-07-31 NOTE — Consult Note (Signed)
Subjective Passed out as he vomited earlier todat at 4.00 am, maroon slool then, none since  Objective:71 yo AA male known to me from 05/27/2010 when he presentes wit H.Pylori positive pyloric channel bleeding ulcer, according to the letter we sent him, he was treated with 2 antibiotic regimen, but it is not certain.He never followed up in the office for lack of insurance. Has has been having lower abdominal discomfort and change in his bowl habits . I don't have any record of him having colonoscopy. He takes ASA, no alcohol, he smokes.  Vital signs in last 24 hours: Temp:  [98.6 F (37 C)] 98.6 F (37 C) (03/02 1604) Pulse Rate:  [75-89] 75 (03/02 1919) BP: (112-128)/(69-79) 112/79 mmHg (03/02 1919) SpO2:  [99 %] 99 % (03/02 1604)   General:   Alert,  pleasant, cooperative in NAD Head:  Normocephalic and atraumatic. Eyes:  Sclera clear, no icterus.   Conjunctiva pink. Mouth:  No deformity or lesions, dentition normal. Neck:  Supple; no masses or thyromegaly. Heart:  Regular rate and rhythm; no murmurs, clicks, rubs,  or gallops. Lungs:  No wheezes or rales Abdomen:  Soft, normal bowl sounds, voluntary guarding, mild tenderness lower abdomen  Msk:  Symmetrical without gross deformities. Normal posture. Pulses:  Normal pulses noted. Extremities:  Without clubbing or edema. Neurologic:  Alert and  oriented x4;  grossly normal neurologically. Skin:  Intact without significant lesions or rashes.  Intake/Output from previous day:   Intake/Output this shift:    Lab Results:  Recent Labs  07/31/12 1634  WBC 7.6  HGB 13.5  HCT 40.6  PLT 240   BMET  Recent Labs  07/31/12 1634  NA 135  K 3.5  CL 99  CO2 25  GLUCOSE 121*  BUN 18  CREATININE 0.88  CALCIUM 8.7   LFT  Recent Labs  07/31/12 1634  PROT 7.3  ALBUMIN 3.3*  AST 15  ALT 15  ALKPHOS 73  BILITOT 0.5   PT/INR  Recent Labs  07/31/12 1634  LABPROT 12.8  INR 0.97   Hepatitis Panel No results found  for this basename: HEPBSAG, HCVAB, HEPAIGM, HEPBIGM,  in the last 72 hours  Studies/Results: No results found.   ASSESSMENT:   Active Problems:   Syncope   GI bleed     PLAN:   Acute GIB not certain if upper or lower, his Hgb is normal and BUN is normal, but he has a hx of major UGIB 2 years ago from an ulcer. We are not sure if he was treated for H.Pylori because he was a no show.in the office. ( it is probably in his paper chart in the office). Consider diverticular bleed if EGD negative. His VS's are stable. Plan:  EGD tonight, check H.Pylori If negative,will need colonoscopy PPI  Follow H/H- RBC bleeding scan if bleeding continues     LOS: 0 days   Lina Sar  07/31/2012, 7:48 PM

## 2012-08-01 ENCOUNTER — Encounter (HOSPITAL_COMMUNITY): Payer: Self-pay | Admitting: Internal Medicine

## 2012-08-01 DIAGNOSIS — D62 Acute posthemorrhagic anemia: Secondary | ICD-10-CM | POA: Diagnosis present

## 2012-08-01 LAB — HEMOGLOBIN
Hemoglobin: 11.2 g/dL — ABNORMAL LOW (ref 13.0–17.0)
Hemoglobin: 10.7 g/dL — ABNORMAL LOW (ref 13.0–17.0)

## 2012-08-01 LAB — MRSA PCR SCREENING: MRSA by PCR: NEGATIVE

## 2012-08-01 MED ORDER — INFLUENZA VIRUS VACC SPLIT PF IM SUSP
0.5000 mL | INTRAMUSCULAR | Status: AC
  Administered 2012-08-02: 0.5 mL via INTRAMUSCULAR
  Filled 2012-08-01 (×2): qty 0.5

## 2012-08-01 NOTE — Clinical Documentation Improvement (Signed)
Anemia Blood Loss Clarification  THIS DOCUMENT IS NOT A PERMANENT PART OF THE MEDICAL RECORD  RESPOND TO THE THIS QUERY, FOLLOW THE INSTRUCTIONS BELOW:  1. If needed, update documentation for the patient's encounter via the notes activity.  2. Access this query again and click edit on the In [employer].  3. After updating, or not, click F2 to complete all highlighted (required) fields concerning your review. Select "additional documentation in the medical record" OR "no additional documentation provided".  4. Click Sign note button.  5. The deficiency will fall out of your In Basket *Please let us know if you are not able to complete this workflow by phone or e-mail (listed below).        08/01/12  Dear Vassie Moselle, C/Associates  In an effort to better capture your patient's severity of illness, reflect appropriate length of stay and utilization of resources, a review of the patient medical record has revealed the following indicators.    Based on your clinical judgment, please clarify and document in a progress note and/or discharge summary the clinical condition associated with the following supporting information:  In responding to this query please exercise your independent judgment.  The fact that a query is asked, does not imply that any particular answer is desired or expected.  In this pt admitted for GIB a review of the medical record reveals the following:   + Melena  + hematochezia  Abnormal H/H (see below)  Treatment  1u RBCs   Clarification Needed   Please clarify if the abnormal H/H in setting of GIB, melena, and hematochezia necessitating the treatment of RBCs can be further specified as one of the diagnoses listed below and document in pn or d/c summary.       Possible Clinical Conditions?   " Expected Acute Blood Loss Anemia  " Acute Blood Loss Anemia  " Acute on chronic blood loss anemia  " Chronic blood loss anemia   " Other  Condition________________  " Cannot Clinically Determine  Risk Factors: (recent surgery, pre op anemia, EBL in OR)  Supporting Information:  Signs and Symptoms GIB, melena, hematochezia,   Diagnostics: Component     Latest Ref Rng 07/31/2012 08/01/2012         9:50 PM   Hemoglobin     13.0 - 17.0 g/dL 19.1 (L) 47.8 (L)  HCT     39.0 - 52.0 % 32.0 (L) 33.9 (L)   Treatments: Transfusion: Listed above  Monitoring H/H  Reviewed: additional documentation in the medical record ljh. Query posted at same time as pn was written. MD has documented ABLA  Thank You,  Enis Slipper   RN, BSN, MSN/Inf, CCDS Clinical Documentation Specialist [facility] Pager: [phone]  Health Information Management [place]

## 2012-08-01 NOTE — Progress Notes (Signed)
[facility] Progress Note  SUBJECTIVE: no complaints, No further bleeding.   OBJECTIVE:  Vital signs in last 24 hours: Temp:  [97.4 F (36.3 C)-98.7 F (37.1 C)] 97.4 F (36.3 C) (03/03 0800) Pulse Rate:  [60-97] 60 (03/03 0800) Resp:  [11-35] 13 (03/03 0800) BP: (94-133)/(58-91) 111/69 mmHg (03/03 0800) SpO2:  [92 %-100 %] 98 % (03/03 0800) Weight:  [135 lb 12.9 oz (61.6 kg)] 135 lb 12.9 oz (61.6 kg) (03/02 2149) Last BM Date: 07/31/12 General:    black male in NAD Heart:  Regular rate and rhythm Abdomen:  Soft, nontender and nondistended. Normal bowel sounds. Extremities:  Without edema. Neurologic:  Alert and oriented,  grossly normal neurologically. Psych:  Cooperative. Normal mood and affect.  Lab Results:  Recent Labs  07/31/12 1634 07/31/12 2150 08/01/12 0625  WBC 7.6  --   --   HGB 13.5 10.8* 11.2*  HCT 40.6 32.0* 33.9*  PLT 240  --   --    BMET  Recent Labs  07/31/12 1634  NA 135  K 3.5  CL 99  CO2 25  GLUCOSE 121*  BUN 18  CREATININE 0.88  CALCIUM 8.7   LFT  Recent Labs  07/31/12 1634  PROT 7.3  ALBUMIN 3.3*  AST 15  ALT 15  ALKPHOS 73  BILITOT 0.5   PT/INR  Recent Labs  07/31/12 1634  LABPROT 12.8  INR 0.97      ASSESSMENT / PLAN:  Upper GI bleed, s/p EGD yesterday with epinephrine injection of what was presumed to be a pyloric channel (versus prepyloric) ulcer. No further bleeding. Biopsies pending. No further bleeding. Hgb stable. On second day of PPI drip, continue through tomorrow then convert to PO.  Hopefully he can be transferred out of unit soon    LOS: 1 day   Willette Cluster  08/01/2012, 8:40 AM  I have personally taken an interval history, reviewed the chart, and examined the patient.  I agree with the extender's note, impression and recommendations.  OK to begin clear liquids.  Barbette Hair. Arlyce Dice, MD, [facility] [phone]

## 2012-08-01 NOTE — Progress Notes (Signed)
CARE MANAGEMENT NOTE 08/01/2012  Patient:  MARKEZ, DOWLAND   Account Number:  [id number]  Date Initiated:  08/01/2012  Documentation initiated by:  DAVIS,RHONDA  Subjective/Objective Assessment:   pt with hypotension and syncope requiring iv support, active gi bld and egd perfromed.     Action/Plan:   home   Anticipated DC Date:  08/04/2012   Anticipated DC Plan:  HOME/SELF CARE  In-house referral  NA      DC Planning Services  NA      PAC Choice  NA   Choice offered to / List presented to:  NA   DME arranged  NA      DME agency  NA     HH arranged  NA      HH agency  NA   Status of service:  Completed, signed off Medicare Important Message given?  NA - LOS <3 / Initial given by admissions (If response is "NO", the following Medicare IM given date fields will be blank) Date Medicare IM given:   Date Additional Medicare IM given:    Discharge Disposition:    Per UR Regulation:  Reviewed for med. necessity/level of care/duration of stay  If discussed at Long Length of Stay Meetings, dates discussed:    Comments:  [id number]/Rhonda Earlene Plater, RN, BSN, CCM:  CHART REVIEWED AND UPDATED.  Next chart review due on [id number]. NO DISCHARGE NEEDS PRESENT AT THIS TIME. CASE MANAGEMENT [phone]

## 2012-08-01 NOTE — Progress Notes (Signed)
[facility] PROGRESS NOTE  Vincent [name] VWU:[id number] DOB: January 01, 1941 DOA: 07/31/2012 PCP: No primary provider on file.  Brief narrative: Vincent [name] is an 72 y.o. male with a PMH of syncope and GIB secondary to multiple ulcers in the pyloric channel status post EGD 05/2010. Control of bleeding of the pyloric channel ulcer was obtained with epinephrine injection at that time. He was discharged home on PPI therapy at that time. 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  He was admitted on 07/31/12 with maroon stools and a syncopal event.  He was taken for emergent EGD which showed a pyloric ulcer with an adherent clot.  Assessment/Plan: Active Problems:  Syncope secondary to GI bleed from a pyloric ulcer / acute blood loss anemia -Admitted to SDU after EGD secondary to high risk of decompensation.  EGD showed a large amount of blood in the stomach and an adherent clot at the pyloric channel, which was injected with epinephrine.   -Hemodynamically stable overnight.  Hemoglobin drop of almost 3 grams noted, but now stable after receiving 1 unit of PRBCs. -Still NPO.  Will defer to gastroenterology consultants as to when to advance diet. -F/U H. Pylori biopsies. -Continue Q 6 hour hemoglobin and hematocrit checks.  -Continue Protonix by continuous infusion.  -F/U EGD prior to discharge or in 4-6 weeks recommended by Vincent [name].  Code Status: Full.  Family Communication: Daughter Vincent [name] [phone].  Disposition Plan: Home when stable.    Medical Consultants:  Dr. Lina [name], Gastroenterology.  Other Consultants:  None.  Anti-infectives:  None.  HPI/Subjective: Vincent [name] is without complaint this a.m.  He denies N/V/D over night.  No dizziness or syncopal episodes.  Objective: Filed Vitals:   08/01/12 0300 08/01/12 0400 08/01/12 0500 08/01/12 0600  BP: 116/70 105/75 115/63 123/86  Pulse: 66 61 60 65  Temp: 98.1 F (36.7 C)  97.9 F (36.6 C)    TempSrc: Oral Oral    Resp: 17 20 13 13   Height:      Weight:      SpO2: 100% 100% 100% 100%    Intake/Output Summary (Last 24 hours) at 08/01/12 0724 Last data filed at 08/01/12 0700  Gross per 24 hour  Intake 1249.17 ml  Output    700 ml  Net 549.17 ml    Exam: Gen:  NAD Cardiovascular:  RRR, No M/R/G Respiratory:  Lungs CTAB Gastrointestinal:  Abdomen soft, NT/ND, + BS Extremities:  No edema, mild clubbing.  Data Reviewed: Basic Metabolic Panel:  Recent Labs Lab 07/31/12 1634  NA 135  K 3.5  CL 99  CO2 25  GLUCOSE 121*  BUN 18  CREATININE 0.88  CALCIUM 8.7   GFR Estimated Creatinine Clearance: 60.2 ml/min (by C-G formula based on Cr of 0.88). Liver Function Tests:  Recent Labs Lab 07/31/12 1634  AST 15  ALT 15  ALKPHOS 73  BILITOT 0.5  PROT 7.3  ALBUMIN 3.3*    Recent Labs Lab 07/31/12 1634  LIPASE 29   Coagulation profile  Recent Labs Lab 07/31/12 1634  INR 0.97    CBC:  Recent Labs Lab 07/31/12 1634 07/31/12 2150 08/01/12 0625  WBC 7.6  --   --   NEUTROABS 5.0  --   --   HGB 13.5 10.8* 11.2*  HCT 40.6 32.0* 33.9*  MCV 85.3  --   --   PLT 240  --   --    Microbiology Recent Results (from the past 240 hour(s))  MRSA PCR SCREENING     Status: None   Collection Time    07/31/12 10:06 PM      Result Value Range Status   MRSA by PCR NEGATIVE  NEGATIVE Final   Comment:            The GeneXpert MRSA Assay (FDA     approved for NASAL specimens     only), is one component of a     comprehensive MRSA colonization     surveillance program. It is not     intended to diagnose MRSA     infection nor to guide or     monitor treatment for     MRSA infections.     Procedures and Diagnostic Studies:  EGD 07/31/12:  ENDOSCOPIC IMPRESSION: The mucosa of the esophagus appeared normal  Large amount of recent blood pooled in the stomach- 600cc's  Adherent blood clot at the pyloric channel, not dislodged, injected   with Epinephrine 3cc, no active bleeding observed, I asume there is an ulcer underneath the blood clot but the clot was covering most of pyloric opening.  Biopsies for H.Pylori taken.   Scheduled Meds: . [START ON 08/02/2012] influenza  inactive virus vaccine  0.5 mL Intramuscular Tomorrow-1000  . sodium chloride  3 mL Intravenous Q12H   Continuous Infusions: . sodium chloride    . pantoprozole (PROTONIX) infusion 8 mg/hr (08/01/12 0700)    Time spent: 35 minutes.   LOS: 1 day   [name],Vincent  [facility] Pager [phone].  If 8PM-8AM, please contact night-coverage at [email or web address], password [facility] 08/01/2012, 7:24 AM

## 2012-08-02 LAB — CBC
HCT: 33.8 % — ABNORMAL LOW (ref 39.0–52.0)
Hemoglobin: 11.3 g/dL — ABNORMAL LOW (ref 13.0–17.0)
MCH: 28.9 pg (ref 26.0–34.0)
MCV: 86.4 fL (ref 78.0–100.0)
RBC: 3.91 MIL/uL — ABNORMAL LOW (ref 4.22–5.81)
WBC: 8.3 10*3/uL (ref 4.0–10.5)

## 2012-08-02 MED ORDER — POLYETHYLENE GLYCOL 3350 17 G PO PACK
17.0000 g | PACK | Freq: Every day | ORAL | Status: DC
  Administered 2012-08-02 – 2012-08-03 (×2): 17 g via ORAL
  Filled 2012-08-02 (×2): qty 1

## 2012-08-02 MED ORDER — PANTOPRAZOLE SODIUM 40 MG PO TBEC
40.0000 mg | DELAYED_RELEASE_TABLET | Freq: Two times a day (BID) | ORAL | Status: DC
  Administered 2012-08-02 – 2012-08-03 (×3): 40 mg via ORAL
  Filled 2012-08-02 (×4): qty 1

## 2012-08-02 NOTE — Progress Notes (Signed)
INITIAL NUTRITION ASSESSMENT  DOCUMENTATION CODES Per approved criteria  -Not Applicable   INTERVENTION: - Assisted pt with ordering meals - Educated pt on diet therapy for stomach ulcers and provided handout of this information. Teach back method used. Encouraged low fat bland diet.  - Will continue to monitor   NUTRITION DIAGNOSIS: Altered GI function related to upper GI bleed as evidenced by MD notes.   Goal: Pt to consume 90% of meals  Monitor:  Weights, labs, intake, GI bleed  Reason for Assessment: Nutrition risk   72 y.o. male  Admitting Dx: Syncope, hematochezia  ASSESSMENT: Pt with history of syncope and multiple ulcers in pyloric channel s/p EGD 05/2010. Biopsies of stomach showed chronic active helicobacter pylori gastritis. Pt had nausea/vomiting the night PTA and presented with bloating and abdominal discomfort. Met with pt today who states all of these symptoms have resolved. Pt reports poor intake since Sunday but before then he had a good appetite and was eating 2-3 meals/day. Pt reports usual weight of 130 pounds, now up to 138 pounds. Pt reports having a dry cough for a while but it\'s getting better. Pt reports having problems moving his bowels at home, discussed with GI nurse practitioner who gave RD verbal order for Miralax. Pt had EGD on 3/2 which showed large amount of recent blood pooled in the stomach with possible ulcer underneath blood clot at pyloric channel per MD notes. No further bleeding yesterday. Diet advanced to regular today.   Height: Ht Readings from Last 1 Encounters:  08/01/12 5\' 3" (1.6 m)    Weight: Wt Readings from Last 1 Encounters:  08/01/12 138 lb 12.8 oz (62.959 kg)    Ideal Body Weight: 124 lb  % Ideal Body Weight: 111  Wt Readings from Last 10 Encounters:  08/01/12 138 lb 12.8 oz (62.959 kg)  08/01/12 138 lb 12.8 oz (62.959 kg)  08/01/12 138 lb 12.8 oz (62.959 kg)    Usual Body Weight: 130 lb  % Usual Body Weight:  106  BMI:  Body mass index is 24.59 kg/(m^2).  Estimated Nutritional Needs: Kcal: 1575-1800 Protein: 65-75g Fluid: 1.5-1.8L/day  Skin: Intact  Diet Order: General  EDUCATION NEEDS: -Education needs addressed - discussed diet therapy for ulcers   Intake/Output Summary (Last 24 hours) at 08/02/12 1455 Last data filed at 08/02/12 1348  Gross per 24 hour  Intake    480 ml  Output   2750 ml  Net  -2270 ml    Last BM: 3/2  Labs:   Recent Labs Lab 07/31/12 1634  NA 135  K 3.5  CL 99  CO2 25  BUN 18  CREATININE 0.88  CALCIUM 8.7  GLUCOSE 121*    CBG (last 3)  No results found for this basename: GLUCAP,  in the last 72 hours  Scheduled Meds: . pantoprazole  40 mg Oral BID  . polyethylene glycol  17 g Oral Daily  . sodium chloride  3 mL Intravenous Q12H    Continuous Infusions:   Past Medical History  Diagnosis Date  . Ulcer disease   . Transfusion history     Secondary to GIB 05/2010  . Right hydrocele   . Iliac artery stenosis, right     90 % by CTA 05/27/2010  . H. pylori infection     Past Surgical History  Procedure Laterality Date  . Esophagogastroduodenoscopy N/A 07/31/2012    Procedure: ESOPHAGOGASTRODUODENOSCOPY (EGD);  Surgeon: Hart Carwin, MD;  Location: [facility];  Service: Endoscopy;  Laterality: N/A;     Levon Hedger MS, RD, LDN [phone] Pager [phone] After Hours Pager

## 2012-08-02 NOTE — Progress Notes (Signed)
[facility] PROGRESS NOTE  Vincent [name] ZOX:[id number] DOB: [birth year] DOA: 07/31/2012 PCP: No primary provider on file.  Brief narrative: Vincent [name] is an 72 y.o. male with a PMH of syncope and GIB secondary to multiple ulcers in the pyloric channel status post EGD 05/2010. Control of bleeding of the pyloric channel ulcer was obtained with epinephrine injection at that time. He was discharged home on PPI therapy at that time. 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  He was admitted on 07/31/12 with maroon stools and a syncopal event.  He was taken for emergent EGD which showed a pyloric ulcer with an adherent clot.  Assessment/Plan: Active Problems:  Syncope secondary to GI bleed from a pyloric ulcer / acute blood loss anemia -Admitted to SDU after EGD secondary to high risk of decompensation.  EGD showed a large amount of blood in the stomach and an adherent clot at the pyloric channel, which was injected with epinephrine.   -Remains hemodynamically stable.  Hemoglobin drop of almost 3 grams noted on 1st 24 hours of hospitalization, but now stable after receiving 1 unit of PRBCs. -Now on clear liquids.  Will defer to gastroenterology consultants as to when to advance diet. -F/U H. Pylori biopsies. -Continue Protonix, change to by mouth route.  -F/U EGD prior to discharge or in 4-6 weeks recommended by Dr. Juanda Chance.  Code Status: Full.  Family Communication: Daughter Vincent [name] [phone]. Family updated by telephone 08/01/12. Disposition Plan: Home when stable.  Medical Consultants:  Dr. Lina Sar, Gastroenterology.  Other Consultants:  None.  Anti-infectives:  None.  HPI/Subjective: Vincent [name] is without complaint this a.m.  He denies N/V/D over night.  No dizziness or syncopal episodes.  No stool since admission.  Hungry.  Objective: Filed Vitals:   08/01/12 1200 08/01/12 1335 08/01/12 2227 08/02/12 0648  BP: 109/69 124/70  120/75 100/62  Pulse: 69 65 69 71  Temp: 97.7 F (36.5 C) 98.4 F (36.9 C) 97.8 F (36.6 C) 97 F (36.1 C)  TempSrc: Axillary Oral Oral Oral  Resp: 22 16 16 16   Height:  5\' 3"  (1.6 m)    Weight:  62.959 kg (138 lb 12.8 oz)    SpO2: 99% 100% 100% 98%    Intake/Output Summary (Last 24 hours) at 08/02/12 0805 Last data filed at 08/02/12 5621  Gross per 24 hour  Intake   1185 ml  Output   2975 ml  Net  -1790 ml    Exam: Gen:  NAD Cardiovascular:  RRR, No M/R/G Respiratory:  Lungs CTAB Gastrointestinal:  Abdomen soft, NT/ND, + BS Extremities:  No edema, mild clubbing.  Data Reviewed: Basic Metabolic Panel:  Recent Labs Lab 07/31/12 1634  NA 135  K 3.5  CL 99  CO2 25  GLUCOSE 121*  BUN 18  CREATININE 0.88  CALCIUM 8.7   GFR Estimated Creatinine Clearance: 60.2 ml/min (by C-G formula based on Cr of 0.88). Liver Function Tests:  Recent Labs Lab 07/31/12 1634  AST 15  ALT 15  ALKPHOS 73  BILITOT 0.5  PROT 7.3  ALBUMIN 3.3*    Recent Labs Lab 07/31/12 1634  LIPASE 29   Coagulation profile  Recent Labs Lab 07/31/12 1634  INR 0.97    CBC:  Recent Labs Lab 07/31/12 1634 07/31/12 2150 08/01/12 0625 08/01/12 1230 08/01/12 1830 08/02/12 0518  WBC 7.6  --   --   --   --  8.3  NEUTROABS 5.0  --   --   --   --   --  HGB 13.5 10.8* 11.2* 10.7* 11.0* 11.3*  HCT 40.6 32.0* 33.9* 31.9* 33.8* 33.8*  MCV 85.3  --   --   --   --  86.4  PLT 240  --   --   --   --  209   Microbiology Recent Results (from the past 240 hour(s))  MRSA PCR SCREENING     Status: None   Collection Time    07/31/12 10:06 PM      Result Value Range Status   MRSA by PCR NEGATIVE  NEGATIVE Final   Comment:            The GeneXpert MRSA Assay (FDA     approved for NASAL specimens     only), is one component of a     comprehensive MRSA colonization     surveillance program. It is not     intended to diagnose MRSA     infection nor to guide or     monitor treatment for      MRSA infections.     Procedures and Diagnostic Studies:  EGD 07/31/12:  ENDOSCOPIC IMPRESSION: The mucosa of the esophagus appeared normal  Large amount of recent blood pooled in the stomach- 600cc's  Adherent blood clot at the pyloric channel, not dislodged, injected  with Epinephrine 3cc, no active bleeding observed, I asume there is an ulcer underneath the blood clot but the clot was covering most of pyloric opening.  Biopsies for H.Pylori taken.   Scheduled Meds: . influenza  inactive virus vaccine  0.5 mL Intramuscular Tomorrow-1000  . sodium chloride  3 mL Intravenous Q12H   Continuous Infusions: . pantoprozole (PROTONIX) infusion 8 mg/hr (08/01/12 1852)    Time spent: 25 minutes.   LOS: 2 days   RAMA,CHRISTINA  [facility] Pager [phone].  If 8PM-8AM, please contact night-coverage at [email or web address], password [facility] 08/02/2012, 8:05 AM

## 2012-08-02 NOTE — Progress Notes (Signed)
[facility] Progress Note  SUBJECTIVE: feels good. No overt GI bleeding. No nausea.   OBJECTIVE:  Vital signs in last 24 hours: Temp:  [97 F (36.1 C)-98.4 F (36.9 C)] 97 F (36.1 C) (03/04 0648) Pulse Rate:  [65-71] 71 (03/04 0648) Resp:  [16-22] 16 (03/04 0648) BP: (100-124)/(62-75) 100/62 mmHg (03/04 0648) SpO2:  [98 %-100 %] 98 % (03/04 0648) Weight:  [138 lb 12.8 oz (62.959 kg)] 138 lb 12.8 oz (62.959 kg) (03/03 1335) Last BM Date: 07/31/12 General:   Pleasant black male in NAD Abdomen:  Soft, nontender and nondistended. Normal bowel sounds. Neurologic:  Alert and oriented,  grossly normal neurologically. Psych:  Cooperative. Normal mood and affect.   Lab Results:  Recent Labs  07/31/12 1634  08/01/12 1230 08/01/12 1830 08/02/12 0518  WBC 7.6  --   --   --  8.3  HGB 13.5  < > 10.7* 11.0* 11.3*  HCT 40.6  < > 31.9* 33.8* 33.8*  PLT 240  --   --   --  209  < > = values in this interval not displayed. BMET  Recent Labs  07/31/12 1634  NA 135  K 3.5  CL 99  CO2 25  GLUCOSE 121*  BUN 18  CREATININE 0.88  CALCIUM 8.7   LFT  Recent Labs  07/31/12 1634  PROT 7.3  ALBUMIN 3.3*  AST 15  ALT 15  ALKPHOS 73  BILITOT 0.5   PT/INR  Recent Labs  07/31/12 1634  LABPROT 12.8  INR 0.97    ASSESSMENT / PLAN:  Upper GI bleed, s/p EGD with epinephrine injection of what was presumed to be a pyloric channel (versus prepyloric) ulcer. No further bleeding. Hgb stable at 11.3. Biopsies still pending.  BID PPI changed from IV to PO today. Will advance to regular diet. Hopefully home soon    LOS: 2 days   Willette Cluster  08/02/2012, 8:38 AM  I have personally taken an interval history, reviewed the chart, and examined the patient.  I agree with the extender's note, impression and recommendations. He will need f/u EGD in 6-8 weeks  Barbette Hair. Arlyce Dice, MD, [facility] [phone]

## 2012-08-02 NOTE — Progress Notes (Signed)
Visited patient with Jan and Gracelyn Nurse (pet therapy team). Patient talked about going home tomorrow. He is excited about getting to eat "a real meal" tonight and is looking forward to eating normally at home. Presence; listening.

## 2012-08-03 ENCOUNTER — Encounter: Payer: Self-pay | Admitting: Nurse Practitioner

## 2012-08-03 DIAGNOSIS — D62 Acute posthemorrhagic anemia: Secondary | ICD-10-CM

## 2012-08-03 DIAGNOSIS — R55 Syncope and collapse: Secondary | ICD-10-CM

## 2012-08-03 LAB — CBC
HCT: 33 % — ABNORMAL LOW (ref 39.0–52.0)
Hemoglobin: 11 g/dL — ABNORMAL LOW (ref 13.0–17.0)
MCH: 28.7 pg (ref 26.0–34.0)
MCHC: 33.3 g/dL (ref 30.0–36.0)
MCV: 86.2 fL (ref 78.0–100.0)
RDW: 13.7 % (ref 11.5–15.5)

## 2012-08-03 MED ORDER — AMOXICILLIN 500 MG PO CAPS
1000.0000 mg | ORAL_CAPSULE | Freq: Two times a day (BID) | ORAL | Status: DC
Start: 2012-08-03 — End: 2012-08-03
  Administered 2012-08-03: 1000 mg via ORAL
  Filled 2012-08-03 (×2): qty 2

## 2012-08-03 MED ORDER — PANTOPRAZOLE SODIUM 40 MG PO TBEC: 40.0000 mg | DELAYED_RELEASE_TABLET | Freq: Two times a day (BID) | ORAL | Status: DC

## 2012-08-03 MED ORDER — AMOXICILLIN 500 MG PO CAPS: 1000.0000 mg | ORAL_CAPSULE | Freq: Two times a day (BID) | ORAL | Status: DC

## 2012-08-03 MED ORDER — HYDROMORPHONE HCL PF 1 MG/ML IJ SOLN
INTRAMUSCULAR | Status: AC
  Filled 2012-08-03: qty 3

## 2012-08-03 MED ORDER — CLARITHROMYCIN 500 MG PO TABS: 500.0000 mg | ORAL_TABLET | Freq: Two times a day (BID) | ORAL | Status: DC

## 2012-08-03 MED ORDER — POLYETHYLENE GLYCOL 3350 17 G PO PACK: 17.0000 g | PACK | Freq: Every day | ORAL | Status: DC

## 2012-08-03 MED ORDER — CLARITHROMYCIN 500 MG PO TABS
500.0000 mg | ORAL_TABLET | Freq: Two times a day (BID) | ORAL | Status: DC
  Administered 2012-08-03: 500 mg via ORAL
  Filled 2012-08-03 (×2): qty 1

## 2012-08-03 NOTE — Progress Notes (Signed)
[facility] Progress Note  SUBJECTIVE: no further bleeding. Eating. Ready to go home  OBJECTIVE:  Vital signs in last 24 hours: Temp:  [97.8 F (36.6 C)-98 F (36.7 C)] 98 F (36.7 C) (03/05 0615) Pulse Rate:  [69-74] 69 (03/05 0615) Resp:  [18-20] 20 (03/05 0615) BP: (99-118)/(63-72) 111/71 mmHg (03/05 0615) SpO2:  [99 %-100 %] 99 % (03/05 0615) Last BM Date: 07/31/12 General:    Pleasant black male in NAD Heart:  Regular rate and rhythm Abdomen:  Soft, nontender and nondistended. Normal bowel sounds. Extremities:  Without edema. Neurologic:  Alert and oriented,  grossly normal neurologically. Psych:  Cooperative. Normal mood and affect.     Lab Results:  Recent Labs  07/31/12 1634  08/01/12 1830 08/02/12 0518 08/03/12 0400  WBC 7.6  --   --  8.3 7.7  HGB 13.5  < > 11.0* 11.3* 11.0*  HCT 40.6  < > 33.8* 33.8* 33.0*  PLT 240  --   --  209 244  < > = values in this interval not displayed. BMET  Recent Labs  07/31/12 1634  NA 135  K 3.5  CL 99  CO2 25  GLUCOSE 121*  BUN 18  CREATININE 0.88  CALCIUM 8.7   LFT  Recent Labs  07/31/12 1634  PROT 7.3  ALBUMIN 3.3*  AST 15  ALT 15  ALKPHOS 73  BILITOT 0.5   PT/INR  Recent Labs  07/31/12 1634  LABPROT 12.8  INR 0.97    ASSESSMENT / PLAN:   Upper GI bleed, s/p EGD with epinephrine injection of what was presumed to be a pyloric channel (versus prepyloric) ulcer. No further bleeding. Hgb stable at 11.0. Biopsies c/w chronic active gastritis with H.pylori. Will begin antibiotics for H.pylori. Advised patient to complete course of the antibiotics at home. Continue PO BID PPI. Hospital follow up appointment made.  He will need repeat EGD in a few weeks to document healing.     LOS: 3 days   Willette Cluster  08/03/2012, 9:08 AM  I have personally taken an interval history, reviewed the chart, and examined the patient.  I agree with the extender's note, impression and  recommendations.  Barbette Hair. Arlyce Dice, MD, [facility] [phone]

## 2012-08-03 NOTE — Discharge Summary (Signed)
Physician Discharge Summary  Vincent [name] GNF:[id number] DOB: January 31, 1941 DOA: 07/31/2012  PCP: No primary provider on file.  Admit date: 07/31/2012 Discharge date: 08/03/2012  Discharge Diagnoses:  Active Problems:   Syncope   GI bleed   Pyloric ulcer   Acute blood loss anemia   Discharge Condition: medically stable for discharge home today. Patient will be discharged with antibiotics for H. pylori infection. Patient instructed to take amoxicillin and clarithromycin for next 2 weeks after discharge.  Diet recommendation: As tolerated  History of present illness:  72 year old gentleman with past medical history significant for GI bleed and syncope secondary to multiple pyloric ulcers identified status post EGD December 2011. At that time bleeding was controlled with epinephrine injections. He was on Protonix therapy at that time. Biopsies at that time showed chronic active helicobacter pylori gastritis. Patient was admitted on 07/31/12 for maroon stools and a syncopal event. Patient is status post EGD with findings of a large ulcer with an adherent clot.   Assessment/Plan:   Principal problem: *Syncope secondary to GI bleed from a pyloric ulcer / acute blood loss anemia   Patient was initially admitted to ICU status post EGD secondary to high risk of decompensation. EGD showed findings consistent with large amount of blood in the stomach and an adherent clot in pyloric channel. Epinephrine was injected to control the bleed  No further bleeding reported.  Hemoglobin remains stable in range 11.0 - 11.3. Patient is status post 1 unit of PRBC since admission.  Diet was advanced to as tolerated.  Patient is on antibiotics for H. pylori infection. Patient was instructed to take amoxicillin and clarithromycin for 2 weeks upon discharge. In addition he will continue Protonix 40 mg twice daily.  Patient will followup with GI in 4-6 weeks, per scheduled appointment for followup EGD.  Code  Status: Full.  Family Communication: Daughter Knute Neu [phone]. Family is not at bedside today.   Medical Consultants:  Dr. Lina Sar, Gastroenterology. Other Consultants:  None. Anti-infectives:  Amoxicillin and clarithromycin for 2 weeks after discharge   Discharge Exam: Filed Vitals:   08/03/12 0615  BP: 111/71  Pulse: 69  Temp: 98 F (36.7 C)  Resp: 20   Filed Vitals:   08/02/12 0648 08/02/12 1347 08/02/12 2245 08/03/12 0615  BP: 100/62 118/72 99/63 111/71  Pulse: 71 74 74 69  Temp: 97 F (36.1 C) 97.8 F (36.6 C) 98 F (36.7 C) 98 F (36.7 C)  TempSrc: Oral Oral Oral Oral  Resp: 16 18 20 20   Height:      Weight:      SpO2: 98% 100% 99% 99%    General: Pt is alert, follows commands appropriately, not in acute distress Cardiovascular: Regular rate and rhythm, S1/S2 +, no murmurs, no rubs, no gallops Respiratory: Clear to auscultation bilaterally, no wheezing, no crackles, no rhonchi Abdominal: Soft, non tender, non distended, bowel sounds +, no guarding Extremities: no edema, no cyanosis, pulses palpable bilaterally DP and PT Neuro: Grossly nonfocal  Discharge Instructions  Discharge Orders   Future Appointments Provider Department Dept Phone   08/18/2012 1:30 PM Meredith Pel, NP [facility] [phone]   Future Orders Complete By Expires     Call MD for:  difficulty breathing, headache or visual disturbances  As directed     Call MD for:  persistant dizziness or light-headedness  As directed     Call MD for:  persistant nausea and vomiting  As directed  Call MD for:  severe uncontrolled pain  As directed     Diet - low sodium heart healthy  As directed     Discharge instructions  As directed     Comments:      Please continue amoxicillin and clarithromycin for next 2 weeks on discharge for H. pylori infection. Continue protonic 40 mg twice daily.    Increase activity slowly  As directed         Medication  List    TAKE these medications       amoxicillin 500 MG capsule  Commonly known as:  AMOXIL  Take 2 capsules (1,000 mg total) by mouth 2 (two) times daily at 10 AM and 5 PM.     aspirin EC 81 MG tablet  Take 81 mg by mouth daily.     clarithromycin 500 MG tablet  Commonly known as:  BIAXIN  Take 1 tablet (500 mg total) by mouth every 12 (twelve) hours.     NYQUIL 60-7.10-28-998 MG/30ML Liqd  Generic drug:  Pseudoeph-Doxylamine-DM-APAP  Take 30 mLs by mouth at bedtime as needed (cold/flu symptoms).     pantoprazole 40 MG tablet  Commonly known as:  PROTONIX  Take 1 tablet (40 mg total) by mouth 2 (two) times daily.     polyethylene glycol packet  Commonly known as:  MIRALAX / GLYCOLAX  Take 17 g by mouth daily.     psyllium 95 % Pack  Commonly known as:  HYDROCIL/METAMUCIL  Take 1 packet by mouth daily.           Follow-up Information   Follow up with Willette Cluster, NP On 08/18/2012. (1:30pm)    Contact information:   [address] [place] [address] [phone]        The results of significant diagnostics from this hospitalization (including imaging, microbiology, ancillary and laboratory) are listed below for reference.    Significant Diagnostic Studies: No results found.  Microbiology: Recent Results (from the past 240 hour(s))  MRSA PCR SCREENING     Status: None   Collection Time    07/31/12 10:06 PM      Result Value Range Status   MRSA by PCR NEGATIVE  NEGATIVE Final     Labs: Basic Metabolic Panel:  Recent Labs Lab 07/31/12 1634  NA 135  K 3.5  CL 99  CO2 25  GLUCOSE 121*  BUN 18  CREATININE 0.88  CALCIUM 8.7   Liver Function Tests:  Recent Labs Lab 07/31/12 1634  AST 15  ALT 15  ALKPHOS 73  BILITOT 0.5  PROT 7.3  ALBUMIN 3.3*    Recent Labs Lab 07/31/12 1634  LIPASE 29   No results found for this basename: AMMONIA,  in the last 168 hours CBC:  Recent Labs Lab 07/31/12 1634  08/01/12 0625 08/01/12 1230  08/01/12 1830 08/02/12 0518 08/03/12 0400  WBC 7.6  --   --   --   --  8.3 7.7  NEUTROABS 5.0  --   --   --   --   --   --   HGB 13.5  < > 11.2* 10.7* 11.0* 11.3* 11.0*  HCT 40.6  < > 33.9* 31.9* 33.8* 33.8* 33.0*  MCV 85.3  --   --   --   --  86.4 86.2  PLT 240  --   --   --   --  209 244  < > = values in this interval not displayed.  Time  coordinating discharge: Over 30 minutes  Signed:  Manson Passey, MD  [facility]  08/03/2012, 11:19 AM  Pager #: [phone]

## 2012-08-04 LAB — TYPE AND SCREEN
ABO/RH(D): O POS
Antibody Screen: NEGATIVE
Unit division: 0

## 2012-08-18 ENCOUNTER — Encounter: Payer: Self-pay | Admitting: Nurse Practitioner

## 2012-08-18 ENCOUNTER — Ambulatory Visit (INDEPENDENT_AMBULATORY_CARE_PROVIDER_SITE_OTHER): Payer: Medicare Other | Admitting: Nurse Practitioner

## 2012-08-18 VITALS — BP 96/70 | HR 68 | Ht 63.0 in | Wt 140.6 lb

## 2012-08-18 DIAGNOSIS — K922 Gastrointestinal hemorrhage, unspecified: Secondary | ICD-10-CM

## 2012-08-18 DIAGNOSIS — K259 Gastric ulcer, unspecified as acute or chronic, without hemorrhage or perforation: Secondary | ICD-10-CM

## 2012-08-18 NOTE — Progress Notes (Signed)
08/18/2012 Vincent [name] [id number] March 07, 1941   History of Present Illness:  Patient is a 72 year old male known to Dr. Juanda Chance. He had an upper GI bleed secondary to a pyloric channel ulcer in 2011. Patient was hospitalized a few weeks ago with another GI bleed. Upper endoscopy 07/31/12 revealed a large amount of blood in his stomach. Prepyloric area had a large adherent clot extending into the pyloric channel, no active bleeding was seen but the clot appeared fresh.  Biopsies revealed chronic, active gastritis with H. pylori infection, patient has completed course of amoxicillin and Biaxin. He is still on twice daily PPI. Patient is here today for a hospital followup. He does need a another EGD to document ulcer healing.  Current Medications, Allergies, Past Medical History, Past Surgical History, Family History and Social History were reviewed in [employer] record.  Studies: EGD 07/31/12 Findings: large amount of blood in stomach. Prepyloric area had a large adherent clot extending into the pyloric channel, no active bleeding was seen but the clot appeared fresh. Pyloric channel was edematous with erosions. Area was injected with epinephrine. Biopsies revealed chronic, active gastritis with H. pylori infection   Physical Exam: General: Well developed , black male in no acute distress Head: Normocephalic and atraumatic Eyes:  sclerae anicteric, conjunctiva pink  Ears: Normal auditory acuity Lungs: Clear throughout to auscultation Heart: Regular rate and rhythm Abdomen: Soft, non tender and non distended. No masses, no hepatomegaly. Normal bowel sounds Neurological: Alert oriented x 4, grossly nonfocal Psychological:  Alert and cooperative. Normal mood and affect  Assessment and Recommendations:  Recent upper GI bleed secondary to pyloric channel ulcer. Biopsies positive for H.pylori. Patient completed Biaxin and Amoxicillin. Will reduce his PPI to once daily now. He  takes a baby ASA but no other NSAIDS. Patent scheduled for repeat EGD 09/23/12 with Dr. Juanda Chance to document healing.

## 2012-08-18 NOTE — Patient Instructions (Addendum)
Decrease the Pantoprazole Sodium (Protonix)  40 mg to once daily.  You have been scheduled for an endoscopy with propofol. Please follow written instructions given to you at your visit today. If you use inhalers (even only as needed), please bring them with you on the day of your procedure.

## 2012-08-19 ENCOUNTER — Encounter: Payer: Self-pay | Admitting: Nurse Practitioner

## 2012-08-19 NOTE — Progress Notes (Signed)
Reviewed and agree.

## 2012-09-23 ENCOUNTER — Encounter: Payer: Self-pay | Admitting: Internal Medicine

## 2012-09-23 ENCOUNTER — Other Ambulatory Visit (INDEPENDENT_AMBULATORY_CARE_PROVIDER_SITE_OTHER): Payer: Medicare Other

## 2012-09-23 ENCOUNTER — Encounter: Payer: Self-pay | Admitting: *Deleted

## 2012-09-23 ENCOUNTER — Other Ambulatory Visit: Payer: Self-pay | Admitting: *Deleted

## 2012-09-23 ENCOUNTER — Ambulatory Visit (AMBULATORY_SURGERY_CENTER): Payer: Medicare Other | Admitting: Internal Medicine

## 2012-09-23 VITALS — BP 129/93 | HR 75 | Temp 97.6°F | Resp 23 | Ht 63.0 in | Wt 140.0 lb

## 2012-09-23 DIAGNOSIS — K2990 Gastroduodenitis, unspecified, without bleeding: Secondary | ICD-10-CM

## 2012-09-23 DIAGNOSIS — K259 Gastric ulcer, unspecified as acute or chronic, without hemorrhage or perforation: Secondary | ICD-10-CM

## 2012-09-23 DIAGNOSIS — K922 Gastrointestinal hemorrhage, unspecified: Secondary | ICD-10-CM

## 2012-09-23 DIAGNOSIS — K269 Duodenal ulcer, unspecified as acute or chronic, without hemorrhage or perforation: Secondary | ICD-10-CM

## 2012-09-23 DIAGNOSIS — K2970 Gastritis, unspecified, without bleeding: Secondary | ICD-10-CM

## 2012-09-23 LAB — CBC WITH DIFFERENTIAL/PLATELET
Basophils Absolute: 0.1 10*3/uL (ref 0.0–0.1)
Eosinophils Relative: 3.2 % (ref 0.0–5.0)
HCT: 37.3 % — ABNORMAL LOW (ref 39.0–52.0)
Lymphocytes Relative: 19.6 % (ref 12.0–46.0)
Monocytes Relative: 8.4 % (ref 3.0–12.0)
Neutrophils Relative %: 68 % (ref 43.0–77.0)
Platelets: 268 10*3/uL (ref 150.0–400.0)
RDW: 15.7 % — ABNORMAL HIGH (ref 11.5–14.6)
WBC: 6.7 10*3/uL (ref 4.5–10.5)

## 2012-09-23 MED ORDER — SODIUM CHLORIDE 0.9 % IV SOLN: 500.0000 mL | INTRAVENOUS | Status: DC

## 2012-09-23 MED ORDER — PANTOPRAZOLE SODIUM 40 MG PO TBEC: 40.0000 mg | DELAYED_RELEASE_TABLET | Freq: Two times a day (BID) | ORAL | Status: DC

## 2012-09-23 NOTE — Op Note (Addendum)
[facility] [address] [place], [address]   ENDOSCOPY PROCEDURE REPORT  PATIENT: Vincent [name], Vincent [name]  MR#: [id number] BIRTHDATE: 1941/05/30 , 71  yrs. old GENDER: Male ENDOSCOPIST: Hart Carwin, MD REFERRED BY:  follow up EGD PROCEDURE DATE:  09/23/2012 PROCEDURE:  EGD w/ biopsy ASA CLASS:     Class II INDICATIONS:  follow up bleeding prepyloric ulcer 07/2012, , hx of Prep ulcer in 2011- H.pylori positive, now 6 weeks on PPI bid. MEDICATIONS: MAC sedation, administered by CRNA and Propofol (Diprivan) 370 mg IV TOPICAL ANESTHETIC: Cetacaine Spray  DESCRIPTION OF PROCEDURE: After the risks benefits and alternatives of the procedure were thoroughly explained, informed consent was obtained.  The LB GIF-H180 [id number] endoscope was introduced through the mouth and advanced to the second portion of the duodenum. Without limitations.  The instrument was slowly withdrawn as the mucosa was fully examined.      [  Endoscope passed through the posterior pharynx and the esophagus with some difficulty. Patient had a spasm of the upper esophageal sphincter and we had to use [facility] dilator it to open up the upper esophageal sphincter. The esophagus was otherwise unremarkable. The Z line was unremarkable. The endoscope passed into the stomach without difficulty. There was no blood in the stomach gastric folds were normal there was a superficial gastric ulcer in the prepyloric antrum which was 90% healed from previous exam. Multiple biopsies were obtained for H. pylori. There was no signs of bleeding. Gastric outlet was normal. Endoscope advanced into the duodenum and showed linear duodenal ulcer measuring about 16 mm with no stigmata of bleeding. Duodenal bulb was deformed. Descending duodenum  appeared normal .endoscope was then brought back into the stomach retroflexed and the cardia and fundus of the stomach were examined and appeared normal     The scope was  then withdrawn from the patient and the procedure completed.  COMPLICATIONS: There were no complications. ENDOSCOPIC IMPRESSION:  persistent prepyloric ulcer now 90% healed status post biopsies Small duodenal ulcer with no segment of bleeding Deformed duodenal bowel consistent with chronic peptic ulcer disease  RECOMMENDATIONS: Await pathology results Continue PPI twice a day- Protonix Obtain serum gastrin level to rule out hyperacidity state,CBC Avoid oral anti-inflammatory medications and aspirin, stop ASA 60 mg Followup in the office 4-6 weeks  REPEAT EXAM: for EGD pending biopsy results.  eSigned:  Hart Carwin, MD 09/23/2012 8:41 AM Revised: 09/23/2012 8:41 AM  CC:  PATIENT NAME:  [name], [name] MR#: [id number]

## 2012-09-23 NOTE — Progress Notes (Signed)
Called to room to assist during endoscopic procedure.  Patient ID and intended procedure confirmed with present staff. Received instructions for my participation in the procedure from the performing physician. ewm 

## 2012-09-23 NOTE — Progress Notes (Signed)
Patient did not experience any of the following events: a burn prior to discharge; a fall within the facility; wrong site/side/patient/procedure/implant event; or a hospital transfer or hospital admission upon discharge from the facility. (G8907) Patient did not have preoperative order for IV antibiotic SSI prophylaxis. (G8918)  

## 2012-09-23 NOTE — Patient Instructions (Addendum)
Discharge instructions given with verbal understanding. Biopsies taken. Resume previous medications. YOU HAD AN ENDOSCOPIC PROCEDURE TODAY AT THE [facility]: Refer to the procedure report that was given to you for any specific questions about what was found during the examination.  If the procedure report does not answer your questions, please call your gastroenterologist to clarify.  If you requested that your care partner not be given the details of your procedure findings, then the procedure report has been included in a sealed envelope for you to review at your convenience later.  YOU SHOULD EXPECT: Some feelings of bloating in the abdomen. Passage of more gas than usual.  Walking can help get rid of the air that was put into your GI tract during the procedure and reduce the bloating. If you had a lower endoscopy (such as a colonoscopy or flexible sigmoidoscopy) you may notice spotting of blood in your stool or on the toilet paper. If you underwent a bowel prep for your procedure, then you may not have a normal bowel movement for a few days.  DIET: Your first meal following the procedure should be a light meal and then it is ok to progress to your normal diet.  A half-sandwich or bowl of soup is an example of a good first meal.  Heavy or fried foods are harder to digest and may make you feel nauseous or bloated.  Likewise meals heavy in dairy and vegetables can cause extra gas to form and this can also increase the bloating.  Drink plenty of fluids but you should avoid alcoholic beverages for 24 hours.  ACTIVITY: Your care partner should take you home directly after the procedure.  You should plan to take it easy, moving slowly for the rest of the day.  You can resume normal activity the day after the procedure however you should NOT DRIVE or use heavy machinery for 24 hours (because of the sedation medicines used during the test).    SYMPTOMS TO REPORT IMMEDIATELY: A gastroenterologist  can be reached at any hour.  During normal business hours, 8:30 AM to 5:00 PM Monday through Friday, call [phone].  After hours and on weekends, please call the GI answering service at [phone] who will take a message and have the physician on call contact you.   Following upper endoscopy (EGD)  Vomiting of blood or coffee ground material  New chest pain or pain under the shoulder blades  Painful or persistently difficult swallowing  New shortness of breath  Fever of 100F or higher  Black, tarry-looking stools  FOLLOW UP: If any biopsies were taken you will be contacted by phone or by letter within the next 1-3 weeks.  Call your gastroenterologist if you have not heard about the biopsies in 3 weeks.  Our staff will call the home number listed on your records the next business day following your procedure to check on you and address any questions or concerns that you may have at that time regarding the information given to you following your procedure. This is a courtesy call and so if there is no answer at the home number and we have not heard from you through the emergency physician on call, we will assume that you have returned to your regular daily activities without incident.  SIGNATURES/CONFIDENTIALITY: You and/or your care partner have signed paperwork which will be entered into your electronic medical record.  These signatures attest to the fact that that the information above on your After   Visit Summary has been reviewed and is understood.  Full responsibility of the confidentiality of this discharge information lies with you and/or your care-partner. 

## 2012-09-26 ENCOUNTER — Encounter: Payer: Self-pay | Admitting: *Deleted

## 2012-09-26 ENCOUNTER — Telehealth: Payer: Self-pay | Admitting: *Deleted

## 2012-09-26 LAB — GASTRIN, SERUM: Gastrin: 56 pg/mL (ref 0–115)

## 2012-09-26 NOTE — Telephone Encounter (Signed)
  Follow up Call-  Call back number 09/23/2012  Post procedure Call Back phone  # [phone], [phone]  Permission to leave phone message Yes     Patient questions:  Left message to call us if necessary.

## 2012-09-27 ENCOUNTER — Encounter: Payer: Self-pay | Admitting: Internal Medicine

## 2012-10-28 ENCOUNTER — Ambulatory Visit: Payer: Medicare Other | Admitting: Internal Medicine

## 2012-10-31 ENCOUNTER — Telehealth: Payer: Self-pay | Admitting: Internal Medicine

## 2012-10-31 NOTE — Telephone Encounter (Signed)
Message copied by Arna Snipe on Mon Oct 31, 2012  8:11 AM ------      Message from: Donata Duff      Created: Fri Oct 28, 2012  2:45 PM       Do not bill ------

## 2013-02-08 DIAGNOSIS — C61 Malignant neoplasm of prostate: Secondary | ICD-10-CM

## 2013-02-08 HISTORY — DX: Malignant neoplasm of prostate: C61

## 2013-02-08 HISTORY — PX: PROSTATE BIOPSY: SHX241

## 2013-02-15 ENCOUNTER — Other Ambulatory Visit (HOSPITAL_COMMUNITY): Payer: Self-pay | Admitting: Urology

## 2013-02-15 DIAGNOSIS — C61 Malignant neoplasm of prostate: Secondary | ICD-10-CM

## 2013-03-06 ENCOUNTER — Encounter (HOSPITAL_COMMUNITY)
Admission: RE | Admit: 2013-03-06 | Discharge: 2013-03-06 | Disposition: A | Discharge status: Home / Self Care | Payer: Medicare Other | Source: Ambulatory Visit | Attending: Urology | Admitting: Urology

## 2013-03-06 DIAGNOSIS — C61 Malignant neoplasm of prostate: Secondary | ICD-10-CM

## 2013-03-06 MED ORDER — TECHNETIUM TC 99M MEDRONATE IV KIT
25.0000 | PACK | Freq: Once | INTRAVENOUS | Status: AC | PRN
  Administered 2013-03-06: 25 via INTRAVENOUS

## 2014-10-03 ENCOUNTER — Encounter: Payer: Self-pay | Admitting: Radiation Oncology

## 2014-10-03 NOTE — Progress Notes (Addendum)
GU Location of Tumor / Histology:Adenocarinoma of the Prostate   If Prostate Cancer, Gleason Score is  (4+3 in 2 cores), (3+4 in 5 cores), (3+3 in 4 cores)  and PSA is (31.5)  Vincent [name] was seen in consultation with Dr. Festus Aloe for an elevated PSA on 12/26/12.  His PSA in June 2014 was 31.5.  And had a biopsy on 02/08/2013. At this time he was noted to have a slow/weak stream with hesistancy, frequency and urgency.   On 09/26/14 it was noted thea he has an ocassional weak stream, incomplete emptying, intermittent, weak stream.  No dysuria or gross hematuria.   Negative Bone Scan on 03/2014. IPSS 13 08/2014   Past/Anticipated interventions by urology, if any: Dr. Rodman Key   Past/Anticipated interventions by medical oncology, if any: None at this time  Weight changes, if any: gained, appetite 100%  Bowel/Bladder complaints, if any:   On 09/26/14 it was noted thea he has an ocassional weak stream, incomplete emptying, intermittent, weak stream.  No dysuria or gross hematuria nocturia 4-5x   Nausea/Vomiting, if any: none  Pain issues, if any:  none  SAFETY ISSUES: NO  Prior radiation? No  Pacemaker/ICD? No  Possible current pregnancy? N/A  Is the patient on methotrexate? No  Current Complaints / other details: Single,  3 children,  Education 3rd grade level,reads some, Vincent [name] daughter with patient, IPSS score =17 BP 172/93 mmHg  Pulse 68  Temp(Src) 97.5 F (36.4 C) (Oral)  Resp 20  Ht 5\' 3"  (1.6 m)  Wt 161 lb 8 oz (73.256 kg)  BMI 28.62 kg/m2  SpO2 100%  Wt Readings from Last 3 Encounters:  09/23/12 140 lb (63.504 kg)  08/18/12 140 lb 9.6 oz (63.776 kg)  08/01/12 138 lb 12.8 oz (62.959 kg)

## 2014-10-04 ENCOUNTER — Ambulatory Visit
Admission: RE | Admit: 2014-10-04 | Discharge: 2014-10-04 | Disposition: A | Discharge status: Home / Self Care | Payer: Medicaid Other | Source: Ambulatory Visit | Attending: Radiation Oncology | Admitting: Radiation Oncology

## 2014-10-04 ENCOUNTER — Encounter: Payer: Self-pay | Admitting: Radiation Oncology

## 2014-10-04 ENCOUNTER — Encounter: Payer: Self-pay | Admitting: Medical Oncology

## 2014-10-04 VITALS — BP 172/93 | HR 68 | Temp 97.5°F | Resp 20 | Ht 63.0 in | Wt 161.5 lb

## 2014-10-04 DIAGNOSIS — C61 Malignant neoplasm of prostate: Secondary | ICD-10-CM

## 2014-10-04 HISTORY — DX: Malignant neoplasm of prostate: C61

## 2014-10-04 NOTE — Progress Notes (Signed)
[facility] NEW PATIENT EVALUATION  Name: Vincent [name] MRN: [id number]  Date:   10/04/2014           DOB: 02-28-1941  Status: outpatient   CC: Dr. Debbe Odea, MD    REFERRING PHYSICIAN: Festus Aloe, MD   DIAGNOSIS: Clinical stage  T2c vs. T3a  high risk adenocarcinoma prostate   HISTORY OF PRESENT ILLNESS:  Vincent [name] is a 74 y.o. male who is seen today through the courtesy of Dr. Junious Silk for evaluation of his clinical stage T2c vs. T3a high risk adenocarcinoma prostate.  He presented in 2014 with a PSA around 30.  He underwent ultrasound-guided biopsies on 02/08/2013 with a diagnosis of adenocarcinoma and all but one biopsy.  2 biopsies had Gleason 7 (4+3), 5 biopsies had Gleason 7 (3+4), and 4 biopsies a Gleason 6 (3+3).  He had high disease volume along his right mid gland and base.  His gland volume was 25 mL.  He was apparently lost to follow-up.  He was seen by Dr. Junious Silk on 09/26/2014 and had a PSA of 39.35.  He is undergoing restaging with a bone scan and CT scan tomorrow.  He is doing reasonably well from a GU and GI standpoint except for urinary obstructive symptoms with an I PSS score of 17 today.  He has not tried an alpha blocker.  He has erectile dysfunction.  PREVIOUS RADIATION THERAPY: No   PAST MEDICAL HISTORY:  has a past medical history of Ulcer disease; Transfusion history; Right hydrocele; Iliac artery stenosis, right; H. pylori infection; GI bleed; and Prostate cancer (02/08/2013).     PAST SURGICAL HISTORY:  Past Surgical History  Procedure Laterality Date  . Esophagogastroduodenoscopy N/A 07/31/2012    Procedure: ESOPHAGOGASTRODUODENOSCOPY (EGD);  Surgeon: Lafayette Dragon, MD;  Location: [facility];  Service: Endoscopy;  Laterality: N/A;  . Ulcer surgery    . Prostate biopsy  02/08/2013     FAMILY HISTORY: family history includes Breast cancer in his mother; Colon cancer in his brother and brother;  Prostate cancer in his brother.  A brother was diagnosed with prostate cancer in his 29s.  His father died of old age at 41 and his mother died from breast cancer at 55.   SOCIAL HISTORY:  reports that he has quit smoking. He has never used smokeless tobacco. He reports that he does not drink alcohol or use illicit drugs.   Single, 3 children.  Retired Building control surveyor.  ALLERGIES: Review of patient's allergies indicates no known allergies.   MEDICATIONS:  Current Outpatient Prescriptions  Medication Sig Dispense Refill  . aspirin EC 81 MG tablet Take 81 mg by mouth daily.    . pantoprazole (PROTONIX) 40 MG tablet Take 1 tablet (40 mg total) by mouth 2 (two) times daily. (Patient not taking: Reported on 10/04/2014) 60 tablet 11  . polyethylene glycol (MIRALAX / GLYCOLAX) packet Take 17 g by mouth daily. (Patient not taking: Reported on 10/04/2014) 14 each 0  . Pseudoeph-Doxylamine-DM-APAP (NYQUIL) 60-7.10-28-998 MG/30ML LIQD Take 30 mLs by mouth at bedtime as needed (cold/flu symptoms).    . psyllium (HYDROCIL/METAMUCIL) 95 % PACK Take 1 packet by mouth daily.     No current facility-administered medications for this encounter.     REVIEW OF SYSTEMS:  Pertinent items are noted in HPI.    PHYSICAL EXAM:  height is 5\' 3"  (1.6 m) and weight is 161 lb 8 oz (73.256 kg). His oral temperature is 97.5 F (  36.4 C). His blood pressure is 172/93 and his pulse is 68. His respiration is 20 and oxygen saturation is 100%.   Head and neck examination: Grossly unremarkable.  Nodes: Without palpable cervical or supra-clavicular lymphadenopathy.  Chest: Lungs clear.  Abdomen: Without masses organomegaly.  Genitalia: Unremarkable to inspection.  Rectal: The prostate gland is normal in size.  The entire right lobe is raised and indurated with distortion of the right lateral sulcus.  I am concerned he may have T3 disease.  Extremities: Without edema.   LABORATORY DATA:  Lab Results  Component Value Date   WBC 6.7  09/23/2012   HGB 12.1* 09/23/2012   HCT 37.3* 09/23/2012   MCV 81.1 09/23/2012   PLT 268.0 09/23/2012   Lab Results  Component Value Date   NA 135 07/31/2012   K 3.5 07/31/2012   CL 99 07/31/2012   CO2 25 07/31/2012   Lab Results  Component Value Date   ALT 15 07/31/2012   AST 15 07/31/2012   ALKPHOS 73 07/31/2012   BILITOT 0.5 07/31/2012   PSA 39.35 from 09/26/2014   IMPRESSION: Clinical stage T2c versus T3 1 high risk adenocarcinoma prostate.  I explained to the patient and his daughter that his prognosis is related to his stage, PSA level, and Gleason score.  His stage is at least of intermediate favorability.  His PSA over 20 is unfavorable in his Gleason score of 7 is of intermediate favorability.  This places him in the "high-risk prognostic group".  We discussed management options, and by default he would be best served by 8 weeks of external beam/IMRT.  He should also receive androgen deprivation therapy for 2 years.  We discussed the side effects of androgen deprivation therapy and both acute and late toxicities of radiation therapy.  He wishes to proceed as outlined.  I spoke with Dr. Lyndal Rainbow nurse and she will get him started on androgen deprivation therapy as early as tomorrow.  I will need to get him scheduled for placement of 3 gold seed markers within the next one to 2 months for image guidance during his treatment.  Follow visit with me in 2 months and we will start his radiation therapy in approximately 2-1/2-3 months.  We will await the results of his CT scan of the abdomen/pelvis and bone scan.   PLAN: As discussed above.   I spent 45 minutes face to face with the patient and more than 50% of that time was spent in counseling and/or coordination of care.

## 2014-10-04 NOTE — Progress Notes (Signed)
Please see the Nurse Progress Note in the MD Initial Consult Encounter for this patient. 

## 2014-10-04 NOTE — [facility] Oncology Navigator Note (Signed)
I met [name] and his daughter Vincent [name] during his visit with Dr. Valere Dross. I introduced myself as the Prostate Oncology Nurse Navigator and gave them my business card. I explained to them what my role is as the navigator i and how I will be available to help them as he begins his journey of radiation therapy. I asked [name] to call me with any questions or concerns. They both voiced understanding.     Cira Rue, RN, BSN, [place]  [phone]  Fax [phone]

## 2014-10-08 ENCOUNTER — Telehealth: Payer: Self-pay | Admitting: Medical Oncology

## 2014-10-08 NOTE — Telephone Encounter (Signed)
Pt's daughter [name] called stating that her dad was not able to get his hormone injection on Friday because she had to get to work. He was able to get his CT done but she asked if I could call Dr. Lyndal Rainbow office to get his injection rescheduled. I called [facility] and his injection is now scheduled for May 19 at 8:15am. He voiced date and time back to me.   Cira Rue, RN, BSN, [place]  [phone] Fax [phone]

## 2014-10-17 ENCOUNTER — Telehealth: Payer: Self-pay | Admitting: Medical Oncology

## 2014-10-17 NOTE — Telephone Encounter (Signed)
I spoke with Vincent [name] to inform her that her father is not having another biopsy. I informed her he will be getting his gold markers placed which is a part of the radiation planning. Dr. Valere Dross had discussed this with them in the consult appointment but she had forgotten. I discussed with her the purpose and how they place them. I explained without the markers he can not get his radiation. She voiced understanding and she will call her father back to let him know. I asked her to call me back if she has further questions or concerns. She voiced understanding.    Cira Rue, RN, BSN, [place]  [phone] Fax [phone]

## 2014-10-17 NOTE — Telephone Encounter (Signed)
I spoke with scheduling in Dr. Lyndal Rainbow office to see what type of procedures Mr. Sur is scheduled for tomorrow. She state his Androgen Deprivation injection and the placement of his gold markers. I explained that pt thinks he is having another biopsy. I informed her I will contact the [name]-daughter with this information.   Cira Rue, RN, BSN, [place]  [phone]  Fax [phone]

## 2014-10-17 NOTE — Telephone Encounter (Signed)
Pt's daughter [name] called stating her dad is all upset. He is going tomorrow to get his hormone injection and now he thinks he is going to get another biopsy. He states his appointment has gone from being 20 minutes to 1 1/2 hours. He is upset and refuses to get another biopsy. I will call Dr. Darden [place] office to verify what procedures he is scheduled for tomorrow. She voiced understanding.  Cira Rue, RN, BSN, [place]  [phone]  Fax [phone]

## 2014-12-12 NOTE — Progress Notes (Signed)
Vincent [name] had placement of Gold seed markers and ADT - Lupron Q 6 month injection on 10/18/14  Hot flashes:  Urination Pattern:

## 2014-12-18 ENCOUNTER — Ambulatory Visit
Admission: RE | Admit: 2014-12-18 | Discharge: 2014-12-18 | Disposition: A | Discharge status: Home / Self Care | Payer: Medicaid Other | Source: Ambulatory Visit | Attending: Radiation Oncology | Admitting: Radiation Oncology

## 2014-12-18 ENCOUNTER — Ambulatory Visit: Payer: Medicaid Other

## 2015-01-16 ENCOUNTER — Encounter: Payer: Self-pay | Admitting: Radiation Oncology

## 2015-01-16 ENCOUNTER — Encounter: Payer: Self-pay | Admitting: Medical Oncology

## 2015-01-16 ENCOUNTER — Ambulatory Visit
Admission: RE | Admit: 2015-01-16 | Discharge: 2015-01-16 | Disposition: A | Discharge status: Home / Self Care | Payer: Medicare HMO | Source: Ambulatory Visit | Attending: Radiation Oncology | Admitting: Radiation Oncology

## 2015-01-16 VITALS — BP 123/85 | HR 79 | Temp 97.7°F | Ht 63.0 in | Wt 158.2 lb

## 2015-01-16 DIAGNOSIS — C61 Malignant neoplasm of prostate: Secondary | ICD-10-CM | POA: Diagnosis present

## 2015-01-16 NOTE — Progress Notes (Signed)
Vincent [name] here for reassessment for prostate cancer.  He received his Lupron injection 10/18/14 and has his gold marker as of the same date.  He reports hot flashes at this time, but reports that he is not having any difficulty sleeping at night, but note he ets "more tired quicker". He reports "good" urine flow without any straining nor intermittent stream.  Nocturia 3-4 times on average, but "sometimes, even more than that'.

## 2015-01-16 NOTE — Progress Notes (Signed)
CC: Dr. Festus Aloe  Follow-up note:  [name] returns today for review and scheduling of his external beam/IMRT in the management of his T2c versus T3a high risk adenocarcinoma prostate.  I saw him in consultation on 10/04/2014. He presented in 2014 with a PSA around 30. He underwent ultrasound-guided biopsies on 02/08/2013 with a diagnosis of adenocarcinoma and all but one biopsy. 2 biopsies had Gleason 7 (4+3), 5 biopsies had Gleason 7 (3+4), and 4 biopsies a Gleason 6 (3+3). He had high disease volume along his right mid gland and base. His gland volume was 25 mL. He was apparently lost to follow-up. He was seen by Dr. Junious Silk on 09/26/2014 and had a PSA of 39.35.  His staging CT scan of the pelvis in May was without evidence for metastatic disease.  His bone scan from October 2014 was also without evidence for metastatic disease.  He elected external beam/IMRT along with 2 years of androgen deprivation therapy.  He was started on androgen deprivation therapy along with placement of his gold seeds on 10/18/2014 by Dr. Junious Silk.  Of note that he was having severe obstructive symptomatology with an I PSS score of 17 I saw him in early May; this is now 66 without any alpha-blocker.  His major complaint today is that of severe hot flashes and sweats.  He does have mild fatigue.  Physical examination: Alert and oriented. Filed Vitals:   01/16/15 1019  BP: 123/85  Pulse: 79  Temp: 97.7 F (36.5 C)   Rectal examination: The prostate gland is approximately 20 mL.  The previously noted raised indurated right lobe is still present but there is less induration and distortion of the right lateral sulcus.  Impression: Clinical stage T2c versus T3a high risk adenocarcinoma prostate.  We again discussed the potential acute and late toxicities of radiation therapy.  We also discussed bladder filling to minimize urinary toxicity.  Consent was previously signed.  He wants to know if there is any  medication for his hot flashes/sweats, and I told him that Dr. Junious Silk may have a recommendation.  He is to call [facility].  His urination may be improved compared to this past May with regression of his prostate carcinoma from androgen deprivation therapy.  I will have Mr. Bartnick return for CT simulation early next week.  Plan: As above.  30 minutes was spent face-to-face with the patient, primarily counseling patient and coordinating his care.

## 2015-01-16 NOTE — Progress Notes (Signed)
Oncology Nurse Navigator Documentation  Oncology Nurse Navigator Flowsheets 01/16/2015  Navigator Encounter Type Treatment  Patient Visit Type Radonc;Follow-up. Vincent [name] and his daughter Vincent [name] Patient here today to discuss radiation planning and treatments. He had his gold markers and his first ADT injection in May. I reviewed with [name] and patient they will be coming to the cancer center for CT simulation and treatments. They were unclear today where they should go and went to [facility]. I will continue to navigate them closely. Vincent [name] Patient has my office number and I asked her to call me with any questions or concerns. She voiced understanding.  Treatment Phase Other  Visual merchandiser for Upcoming Surgery/ Treatment  Interventions Education Method  Education Method Teach-back;Written  Support Groups/Services Friends and Family  Time Spent with Patient 2

## 2015-01-21 ENCOUNTER — Ambulatory Visit
Admission: RE | Admit: 2015-01-21 | Discharge: 2015-01-21 | Disposition: A | Discharge status: Home / Self Care | Payer: Medicare HMO | Source: Ambulatory Visit | Attending: Radiation Oncology | Admitting: Radiation Oncology

## 2015-01-21 DIAGNOSIS — C61 Malignant neoplasm of prostate: Secondary | ICD-10-CM | POA: Diagnosis not present

## 2015-01-21 NOTE — Progress Notes (Signed)
Complex simulation/treatment planning note: The patient was taken to the CT simulator and placed supine.  A custom Vac lock was constructed for immobilization.  A red rubber tube was placed within the rectal vault.  He was then catheterized and contrast instilled into the bladder/urethra.  He was then scanned.  I chose an isocenter along the prostate.  The CT data set was sent to the MIM planning system where I contoured his prostate, seminal vesicles, rectum, bladder, rectosigmoid colon, and also lymph node PTV.  I am prescribing 7800 cGy in 40 sessions to his prostate PTV which represents the prostate +0.8 cm except for 0.5 cm along the rectum.  I am prescribing 5600 cGy in 40 sessions to his seminal vesicle PTV which represents the seminal vesicles +0.5 cm.  I'm also prescribing 5600 cGy in 40 sessions to his lymph node PTV.  He'll be treated with 6 MV photons with VMAT IMRT.

## 2015-01-22 ENCOUNTER — Encounter: Payer: Self-pay | Admitting: Radiation Oncology

## 2015-01-22 DIAGNOSIS — C61 Malignant neoplasm of prostate: Secondary | ICD-10-CM | POA: Diagnosis not present

## 2015-01-22 NOTE — Progress Notes (Signed)
IMRT simulation/treatment planning note: This to Dr. completed his IMRT treatment planning today in the management of his carcinoma the prostate.  IMRT was chosen to decrease the risk for both acute and late bladder and rectal toxicity compared to conventional or 3-D conformal radiation therapy.  Dose volume histograms were obtained for the target structures including the prostate, seminal vesicles, and pelvic lymph nodes.  He had less than favorable bladder filling, and thus we did not need our bladder goals.  This will improve with better bladder filling during his treatment.  Otherwise we met our departmental goals for target and avoidance structures.  I'm prescribing 7800 cGy in 40 sessions to his prostate PTV and 5600 cGy in 40 sessions to his seminal vesicle PTV, and pelvic lymph node PTV.  He is being treated with dual ARC VMAT IMRT with 6 MV photons.

## 2015-01-24 DIAGNOSIS — C61 Malignant neoplasm of prostate: Secondary | ICD-10-CM | POA: Diagnosis not present

## 2015-01-29 ENCOUNTER — Encounter: Payer: Self-pay | Admitting: Medical Oncology

## 2015-01-30 ENCOUNTER — Ambulatory Visit
Admission: RE | Admit: 2015-01-30 | Discharge: 2015-01-30 | Disposition: A | Discharge status: Home / Self Care | Payer: Medicare HMO | Source: Ambulatory Visit | Attending: Radiation Oncology | Admitting: Radiation Oncology

## 2015-01-30 ENCOUNTER — Encounter: Payer: Self-pay | Admitting: Medical Oncology

## 2015-01-30 DIAGNOSIS — C61 Malignant neoplasm of prostate: Secondary | ICD-10-CM | POA: Diagnosis not present

## 2015-01-30 NOTE — Addendum Note (Signed)
Encounter addended by: Benn Moulder, RN on: 01/30/2015  9:12 AM<BR>     Documentation filed: Charges VN

## 2015-01-31 ENCOUNTER — Ambulatory Visit
Admission: RE | Admit: 2015-01-31 | Discharge: 2015-01-31 | Disposition: A | Discharge status: Home / Self Care | Payer: Medicare HMO | Source: Ambulatory Visit | Attending: Radiation Oncology | Admitting: Radiation Oncology

## 2015-01-31 DIAGNOSIS — C61 Malignant neoplasm of prostate: Secondary | ICD-10-CM | POA: Diagnosis not present

## 2015-02-01 ENCOUNTER — Ambulatory Visit
Admission: RE | Admit: 2015-02-01 | Discharge: 2015-02-01 | Disposition: A | Discharge status: Home / Self Care | Payer: Medicare HMO | Source: Ambulatory Visit | Attending: Radiation Oncology | Admitting: Radiation Oncology

## 2015-02-01 DIAGNOSIS — C61 Malignant neoplasm of prostate: Secondary | ICD-10-CM | POA: Diagnosis not present

## 2015-02-05 ENCOUNTER — Encounter: Payer: Self-pay | Admitting: Radiation Oncology

## 2015-02-05 ENCOUNTER — Ambulatory Visit
Admission: RE | Admit: 2015-02-05 | Discharge: 2015-02-05 | Disposition: A | Discharge status: Home / Self Care | Payer: Medicaid Other | Source: Ambulatory Visit | Attending: Radiation Oncology | Admitting: Radiation Oncology

## 2015-02-05 ENCOUNTER — Ambulatory Visit
Admission: RE | Admit: 2015-02-05 | Discharge: 2015-02-05 | Disposition: A | Discharge status: Home / Self Care | Payer: Medicare HMO | Source: Ambulatory Visit | Attending: Radiation Oncology | Admitting: Radiation Oncology

## 2015-02-05 VITALS — BP 125/81 | HR 79 | Temp 97.7°F | Ht 63.0 in | Wt 160.2 lb

## 2015-02-05 DIAGNOSIS — C61 Malignant neoplasm of prostate: Secondary | ICD-10-CM

## 2015-02-05 NOTE — Progress Notes (Signed)
Vincent [name] has received 4 fractions to the pelvis for prostate cancer.  He reports intermittent straining when voiding and nocturia x 5.  He denies dysuria and denies and proctitis.  Pt here for patient teaching.  Pt given Radiation and You booklet and skin care instructions. Pt reports they have watched the Radiation Therapy Education video on February 15, 2015.  Reviewed areas of pertinence such as diarrhea, fatigue, sexual and fertility changes, skin changes and urinary and bladder changes . Pt able to give teach back of to pat skin, use unscented/gentle soap, use baby wipes, have Imodium on hand, drink plenty of water and sitz bath,Nonapplicable, apply Radiaplex bid, apply Biafine bid, avoid applying anything to skin within 4 hours of treatment, avoid wearing an under wire bra and to use an electric razor if they must shave. Pt demonstrated understanding and needs reinforcement of information given and will contact nursing with any questions or concerns.

## 2015-02-05 NOTE — Progress Notes (Signed)
Weekly Management Note:  Site: Prostate/seminal vessel/pelvic lymph nodes Current Dose:  780  cGy Projected Dose: 7800  cGy  Narrative: The patient is seen today for routine under treatment assessment. CBCT/MVCT images/port films were reviewed. The chart was reviewed.   Bladder filling satisfactory.  No GU or GI difficulties.  Physical Examination:  Filed Vitals:   02/05/15 0950  BP: 125/81  Pulse: 79  Temp: 97.7 F (36.5 C)  .  Weight: 160 lb 3.2 oz (72.666 kg).  No change.  Impression: Tolerating radiation therapy well.  Plan: Continue radiation therapy as planned.

## 2015-02-06 ENCOUNTER — Ambulatory Visit
Admission: RE | Admit: 2015-02-06 | Discharge: 2015-02-06 | Disposition: A | Discharge status: Home / Self Care | Payer: Medicare HMO | Source: Ambulatory Visit | Attending: Radiation Oncology | Admitting: Radiation Oncology

## 2015-02-06 DIAGNOSIS — C61 Malignant neoplasm of prostate: Secondary | ICD-10-CM | POA: Diagnosis not present

## 2015-02-07 ENCOUNTER — Ambulatory Visit
Admission: RE | Admit: 2015-02-07 | Discharge: 2015-02-07 | Disposition: A | Discharge status: Home / Self Care | Payer: Medicare HMO | Source: Ambulatory Visit | Attending: Radiation Oncology | Admitting: Radiation Oncology

## 2015-02-07 DIAGNOSIS — C61 Malignant neoplasm of prostate: Secondary | ICD-10-CM | POA: Diagnosis not present

## 2015-02-08 ENCOUNTER — Ambulatory Visit
Admission: RE | Admit: 2015-02-08 | Discharge: 2015-02-08 | Disposition: A | Discharge status: Home / Self Care | Payer: Medicare HMO | Source: Ambulatory Visit | Attending: Radiation Oncology | Admitting: Radiation Oncology

## 2015-02-08 DIAGNOSIS — C61 Malignant neoplasm of prostate: Secondary | ICD-10-CM | POA: Diagnosis not present

## 2015-02-11 ENCOUNTER — Ambulatory Visit
Admission: RE | Admit: 2015-02-11 | Discharge: 2015-02-11 | Disposition: A | Discharge status: Home / Self Care | Payer: Medicare HMO | Source: Ambulatory Visit | Attending: Radiation Oncology | Admitting: Radiation Oncology

## 2015-02-11 ENCOUNTER — Ambulatory Visit
Admission: RE | Admit: 2015-02-11 | Discharge: 2015-02-11 | Disposition: A | Discharge status: Home / Self Care | Payer: Medicaid Other | Source: Ambulatory Visit | Attending: Radiation Oncology | Admitting: Radiation Oncology

## 2015-02-11 ENCOUNTER — Encounter: Payer: Self-pay | Admitting: Medical Oncology

## 2015-02-11 ENCOUNTER — Encounter: Payer: Self-pay | Admitting: Radiation Oncology

## 2015-02-11 VITALS — BP 122/73 | HR 78 | Temp 97.7°F | Resp 16 | Ht 63.0 in | Wt 161.8 lb

## 2015-02-11 DIAGNOSIS — C61 Malignant neoplasm of prostate: Secondary | ICD-10-CM | POA: Diagnosis not present

## 2015-02-11 MED ORDER — TAMSULOSIN HCL 0.4 MG PO CAPS: 0.4000 mg | ORAL_CAPSULE | Freq: Every day | ORAL | Status: AC

## 2015-02-11 NOTE — Progress Notes (Signed)
Vincent [name] has completed 8 fractions to his prostate.  He denies pain.  He reports his urinary flow is slow.  He reports getting up 4-5 times per night to urinate.  He reports he has a little bit of "stinging" occasionally.  He denies hematuria, diarrhea.  He reports fatigue.  BP 122/73 mmHg  Pulse 78  Temp(Src) 97.7 F (36.5 C) (Oral)  Resp 16  Ht 5\' 3"  (1.6 m)  Wt 161 lb 12.8 oz (73.392 kg)  BMI 28.67 kg/m2

## 2015-02-11 NOTE — Progress Notes (Signed)
Oncology Nurse Navigator Documentation  Oncology Nurse Navigator Flowsheets 01/21/2015 01/30/2015 02/11/2015  Navigator Encounter Type Treatment Treatment Treatment  [name] Visit Type - Radonc Radonc  Treatment Phase CT SIM First Radiation Tx Treatment  Barriers/Navigation Needs - - Education- Dr. Valere Dross is concerned with bladder filling. Pt is not drinking enough water before his treatment. I discussed with him about increasing the amount of water he drinks before arriving here for treatment. He states he drinks a big glass of water but I encouraged him to bring a bottle water and drink on his way here. He voiced understanding. I also discussed with his daughter Vincent [name]. He has also had some frequency and weak stream. Dr. Valere Dross is calling  in a prescription and Vincent [name] is aware. I asked them to call me if any questions or concerns. They voiced understanding.  Education - - Other  Interventions - - Education Method  Education Method - - Teach-back;Verbal  Support Groups/Services - - Friends and Family  Time Spent with [name] - 99 77

## 2015-02-11 NOTE — Progress Notes (Signed)
Weekly Management Note:  Site: Prostate/pelvic lymph nodes Current Dose:  1560  cGy Projected Dose: 7800  cGy  Narrative: The patient is seen today for routine under treatment assessment. CBCT/MVCT images/port films were reviewed. The chart was reviewed.   Bladder filling is satisfactory but not ideal.  I have been calling him in the evening to have him improve his bladder filling.  He is getting up 4-5 times a night and he tells me his stream is slow.  He has not been on an alpha blocker.  No GI difficulties.  Physical Examination:  Filed Vitals:   02/11/15 0935  BP: 122/73  Pulse: 78  Temp: 97.7 F (36.5 C)  Resp: 16  .  Weight: 161 lb 12.8 oz (73.392 kg).  No change.  Impression: Tolerating radiation therapy well, although he is developing worsening obstructive symptoms.  I again encouraged him to improve his bladder filling to minimize urinary toxicity.  I will get him started on tamsulosin.  Plan: Continue radiation therapy as planned.

## 2015-02-12 ENCOUNTER — Ambulatory Visit
Admission: RE | Admit: 2015-02-12 | Discharge: 2015-02-12 | Disposition: A | Discharge status: Home / Self Care | Payer: Medicare HMO | Source: Ambulatory Visit | Attending: Radiation Oncology | Admitting: Radiation Oncology

## 2015-02-12 DIAGNOSIS — C61 Malignant neoplasm of prostate: Secondary | ICD-10-CM | POA: Diagnosis not present

## 2015-02-13 ENCOUNTER — Ambulatory Visit
Admission: RE | Admit: 2015-02-13 | Discharge: 2015-02-13 | Disposition: A | Discharge status: Home / Self Care | Payer: Medicare HMO | Source: Ambulatory Visit | Attending: Radiation Oncology | Admitting: Radiation Oncology

## 2015-02-13 DIAGNOSIS — C61 Malignant neoplasm of prostate: Secondary | ICD-10-CM | POA: Diagnosis not present

## 2015-02-14 ENCOUNTER — Ambulatory Visit
Admission: RE | Admit: 2015-02-14 | Discharge: 2015-02-14 | Disposition: A | Discharge status: Home / Self Care | Payer: Medicare HMO | Source: Ambulatory Visit | Attending: Radiation Oncology | Admitting: Radiation Oncology

## 2015-02-14 DIAGNOSIS — C61 Malignant neoplasm of prostate: Secondary | ICD-10-CM | POA: Diagnosis not present

## 2015-02-15 ENCOUNTER — Ambulatory Visit
Admission: RE | Admit: 2015-02-15 | Discharge: 2015-02-15 | Disposition: A | Discharge status: Home / Self Care | Payer: Medicare HMO | Source: Ambulatory Visit | Attending: Radiation Oncology | Admitting: Radiation Oncology

## 2015-02-15 DIAGNOSIS — C61 Malignant neoplasm of prostate: Secondary | ICD-10-CM | POA: Diagnosis not present

## 2015-02-18 ENCOUNTER — Ambulatory Visit
Admission: RE | Admit: 2015-02-18 | Discharge: 2015-02-18 | Disposition: A | Discharge status: Home / Self Care | Payer: Medicare HMO | Source: Ambulatory Visit | Attending: Radiation Oncology | Admitting: Radiation Oncology

## 2015-02-18 ENCOUNTER — Encounter: Payer: Self-pay | Admitting: Radiation Oncology

## 2015-02-18 ENCOUNTER — Ambulatory Visit
Admission: RE | Admit: 2015-02-18 | Discharge: 2015-02-18 | Disposition: A | Discharge status: Home / Self Care | Payer: Medicaid Other | Source: Ambulatory Visit | Attending: Radiation Oncology | Admitting: Radiation Oncology

## 2015-02-18 VITALS — BP 115/77 | HR 82 | Temp 98.3°F | Ht 63.0 in | Wt 163.8 lb

## 2015-02-18 DIAGNOSIS — C61 Malignant neoplasm of prostate: Secondary | ICD-10-CM

## 2015-02-18 NOTE — Progress Notes (Signed)
Weekly Management Note:  Site: Prostate/pelvic lymph nodes Current Dose:  2535  cGy Projected Dose: 7800  cGy  Narrative: The patient is seen today for routine under treatment assessment. CBCT/MVCT images/port films were reviewed. The chart was reviewed.   Bladder filling is satisfactory but not ideal.  He started on tamsulosin because of slowing of his urinary stream last week.  He feels that his stream is improved but he still having nocturia 4-5.  He probably has some degree of bladder irritability and may need an anti-spasmodic agent.  He feels that his urinary habits are tolerable in that he had significant nocturia/frequency prior to initiation of radiation therapy.  Physical Examination:  Filed Vitals:   02/18/15 0936  BP: 115/77  Pulse: 82  Temp: 98.3 F (36.8 C)  .  Weight: 163 lb 12.8 oz (74.299 kg).  No change.  Impression: Tolerating radiation therapy well except for possible obstructive symptomatology along with irritative symptoms which may benefit from an anti-spasmodic agent if his frequency/nocturia worsens.  Plan: Continue radiation therapy as planned.

## 2015-02-18 NOTE — Progress Notes (Signed)
Vincent [name] has received 13 fractions to his pelvis for prostate cancer.  He reports intermittently dribbling at the end of his urinary stream and has nocturia on an average 4-5.

## 2015-02-19 ENCOUNTER — Ambulatory Visit
Admission: RE | Admit: 2015-02-19 | Discharge: 2015-02-19 | Disposition: A | Discharge status: Home / Self Care | Payer: Medicare HMO | Source: Ambulatory Visit | Attending: Radiation Oncology | Admitting: Radiation Oncology

## 2015-02-19 DIAGNOSIS — C61 Malignant neoplasm of prostate: Secondary | ICD-10-CM | POA: Diagnosis not present

## 2015-02-20 ENCOUNTER — Ambulatory Visit
Admission: RE | Admit: 2015-02-20 | Discharge: 2015-02-20 | Disposition: A | Discharge status: Home / Self Care | Payer: Medicare HMO | Source: Ambulatory Visit | Attending: Radiation Oncology | Admitting: Radiation Oncology

## 2015-02-20 DIAGNOSIS — C61 Malignant neoplasm of prostate: Secondary | ICD-10-CM | POA: Diagnosis not present

## 2015-02-21 ENCOUNTER — Ambulatory Visit
Admission: RE | Admit: 2015-02-21 | Discharge: 2015-02-21 | Disposition: A | Discharge status: Home / Self Care | Payer: Medicare HMO | Source: Ambulatory Visit | Attending: Radiation Oncology | Admitting: Radiation Oncology

## 2015-02-21 DIAGNOSIS — C61 Malignant neoplasm of prostate: Secondary | ICD-10-CM | POA: Diagnosis not present

## 2015-02-22 ENCOUNTER — Ambulatory Visit
Admission: RE | Admit: 2015-02-22 | Discharge: 2015-02-22 | Disposition: A | Discharge status: Home / Self Care | Payer: Medicare HMO | Source: Ambulatory Visit | Attending: Radiation Oncology | Admitting: Radiation Oncology

## 2015-02-22 DIAGNOSIS — C61 Malignant neoplasm of prostate: Secondary | ICD-10-CM | POA: Diagnosis not present

## 2015-02-25 ENCOUNTER — Ambulatory Visit
Admission: RE | Admit: 2015-02-25 | Discharge: 2015-02-25 | Disposition: A | Discharge status: Home / Self Care | Payer: Medicaid Other | Source: Ambulatory Visit | Attending: Radiation Oncology | Admitting: Radiation Oncology

## 2015-02-25 ENCOUNTER — Ambulatory Visit
Admission: RE | Admit: 2015-02-25 | Discharge: 2015-02-25 | Disposition: A | Discharge status: Home / Self Care | Payer: Medicare HMO | Source: Ambulatory Visit | Attending: Radiation Oncology | Admitting: Radiation Oncology

## 2015-02-25 VITALS — BP 143/81 | HR 75 | Temp 97.7°F | Wt 163.0 lb

## 2015-02-25 DIAGNOSIS — C61 Malignant neoplasm of prostate: Secondary | ICD-10-CM

## 2015-02-25 NOTE — Progress Notes (Signed)
Weekly assessment of radiation to pelvis for prostate cancer.Completed 18 of 40 treatments.Denis pain.Urgency of urination and small amount of burning.Nocturia up to 6 or 7 times with small amounts of urine.Bowels are formed and regular.Increase in fatigue. BP 143/81 mmHg  Pulse 75  Temp(Src) 97.7 F (36.5 C)  Wt 163 lb (73.936 kg)

## 2015-02-25 NOTE — Progress Notes (Signed)
   Weekly Management Note:  Outpatient    ICD-9-CM ICD-10-CM   1. Malignant neoplasm of prostate 185 C61     Current Dose:  35.1 Gy  Projected Dose: 78 Gy   Narrative:  The patient presents for routine under treatment assessment.  CBCT/MVCT images/Port film x-rays were reviewed.  The chart was checked. Denies new sx except more fatigue.  He has nocturia and is on Flomax  Physical Findings:  weight is 163 lb (73.936 kg). His temperature is 97.7 F (36.5 C). His blood pressure is 143/81 and his pulse is 75.   Wt Readings from Last 3 Encounters:  02/25/15 163 lb (73.936 kg)  02/18/15 163 lb 12.8 oz (74.299 kg)  02/11/15 161 lb 12.8 oz (73.392 kg)   NAD, well appearing  Impression:  The patient is tolerating radiotherapy.  Plan:  Continue radiotherapy as planned.    ________________________________   Eppie Gibson, M.D.

## 2015-02-26 ENCOUNTER — Ambulatory Visit
Admission: RE | Admit: 2015-02-26 | Discharge: 2015-02-26 | Disposition: A | Discharge status: Home / Self Care | Payer: Medicare HMO | Source: Ambulatory Visit | Attending: Radiation Oncology | Admitting: Radiation Oncology

## 2015-02-26 ENCOUNTER — Encounter: Payer: Self-pay | Admitting: Medical Oncology

## 2015-02-26 DIAGNOSIS — C61 Malignant neoplasm of prostate: Secondary | ICD-10-CM | POA: Diagnosis not present

## 2015-02-27 ENCOUNTER — Ambulatory Visit
Admission: RE | Admit: 2015-02-27 | Discharge: 2015-02-27 | Disposition: A | Discharge status: Home / Self Care | Payer: Medicare HMO | Source: Ambulatory Visit | Attending: Radiation Oncology | Admitting: Radiation Oncology

## 2015-02-27 DIAGNOSIS — C61 Malignant neoplasm of prostate: Secondary | ICD-10-CM | POA: Diagnosis not present

## 2015-02-28 ENCOUNTER — Ambulatory Visit
Admission: RE | Admit: 2015-02-28 | Discharge: 2015-02-28 | Disposition: A | Discharge status: Home / Self Care | Payer: Medicare HMO | Source: Ambulatory Visit | Attending: Radiation Oncology | Admitting: Radiation Oncology

## 2015-02-28 DIAGNOSIS — C61 Malignant neoplasm of prostate: Secondary | ICD-10-CM | POA: Diagnosis not present

## 2015-03-01 ENCOUNTER — Ambulatory Visit
Admission: RE | Admit: 2015-03-01 | Discharge: 2015-03-01 | Disposition: A | Discharge status: Home / Self Care | Payer: Medicare HMO | Source: Ambulatory Visit | Attending: Radiation Oncology | Admitting: Radiation Oncology

## 2015-03-01 DIAGNOSIS — C61 Malignant neoplasm of prostate: Secondary | ICD-10-CM | POA: Diagnosis not present

## 2015-03-04 ENCOUNTER — Encounter: Payer: Self-pay | Admitting: Radiation Oncology

## 2015-03-04 ENCOUNTER — Ambulatory Visit
Admission: RE | Admit: 2015-03-04 | Discharge: 2015-03-04 | Disposition: A | Discharge status: Home / Self Care | Payer: Medicaid Other | Source: Ambulatory Visit | Attending: Radiation Oncology | Admitting: Radiation Oncology

## 2015-03-04 ENCOUNTER — Encounter: Payer: Self-pay | Admitting: Medical Oncology

## 2015-03-04 ENCOUNTER — Ambulatory Visit
Admission: RE | Admit: 2015-03-04 | Discharge: 2015-03-04 | Disposition: A | Discharge status: Home / Self Care | Payer: Medicare HMO | Source: Ambulatory Visit | Attending: Radiation Oncology | Admitting: Radiation Oncology

## 2015-03-04 VITALS — BP 139/87 | HR 77 | Temp 97.7°F | Ht 63.0 in | Wt 163.0 lb

## 2015-03-04 DIAGNOSIS — C61 Malignant neoplasm of prostate: Secondary | ICD-10-CM | POA: Diagnosis not present

## 2015-03-04 NOTE — Progress Notes (Signed)
Oncology Nurse Navigator Documentation  Oncology Nurse Navigator Flowsheets 02/11/2015 02/26/2015 03/04/2015  Navigator Encounter Type Treatment Treatment Treatment  Patient Visit Type Radonc Radonc Radonc- Vincent [name] doing well with radiation. He reports increased nocturia and some stinging and burning. He will see Dr. Valere Dross today and he will  discuss these symptoms.  Treatment Phase Treatment Treatment Treatment  Barriers/Navigation Needs Education - -  Education Other - -  Interventions Education Method - -  Education Method Teach-back;Verbal - -  Support Groups/Services Friends and Family Friends and Family Friends and Family  Time Spent with Patient 54 15 -

## 2015-03-04 NOTE — Progress Notes (Signed)
Weekly Management Note:  Site: Prostate/pelvic lymph nodes Current Dose:  4485  cGy Projected Dose: 7800  cGy  Narrative: The patient is seen today for routine under treatment assessment. CBCT/MVCT images/port films were reviewed. The chart was reviewed.   Bladder filling satisfactory.  He does have more urinary frequency with stinging and burning.  He does have nocturia 6.  He continues with tamsulosin which she believes is helpful.  He finds that his symptoms are tolerable.  Physical Examination:  Filed Vitals:   03/04/15 1009  BP: 139/87  Pulse: 77  Temp: 97.7 F (36.5 C)  .  Weight: 163 lb (73.936 kg).  No change.  Impression: Tolerating radiation therapy well.  Plan: Continue radiation therapy as planned.

## 2015-03-04 NOTE — Progress Notes (Signed)
Mr. Besson reports that he has intermittent, stinging, buning sensation when he voids with nocturia 6 or more times and reports incomplete emptying. Deneisay any rectal soreness or irritation.

## 2015-03-05 ENCOUNTER — Ambulatory Visit
Admission: RE | Admit: 2015-03-05 | Discharge: 2015-03-05 | Disposition: A | Discharge status: Home / Self Care | Payer: Medicare HMO | Source: Ambulatory Visit | Attending: Radiation Oncology | Admitting: Radiation Oncology

## 2015-03-05 DIAGNOSIS — C61 Malignant neoplasm of prostate: Secondary | ICD-10-CM | POA: Diagnosis not present

## 2015-03-06 ENCOUNTER — Ambulatory Visit
Admission: RE | Admit: 2015-03-06 | Discharge: 2015-03-06 | Disposition: A | Discharge status: Home / Self Care | Payer: Medicare HMO | Source: Ambulatory Visit | Attending: Radiation Oncology | Admitting: Radiation Oncology

## 2015-03-06 DIAGNOSIS — C61 Malignant neoplasm of prostate: Secondary | ICD-10-CM | POA: Diagnosis not present

## 2015-03-07 ENCOUNTER — Ambulatory Visit
Admission: RE | Admit: 2015-03-07 | Discharge: 2015-03-07 | Disposition: A | Discharge status: Home / Self Care | Payer: Medicare HMO | Source: Ambulatory Visit | Attending: Radiation Oncology | Admitting: Radiation Oncology

## 2015-03-07 DIAGNOSIS — C61 Malignant neoplasm of prostate: Secondary | ICD-10-CM | POA: Diagnosis not present

## 2015-03-08 ENCOUNTER — Ambulatory Visit
Admission: RE | Admit: 2015-03-08 | Discharge: 2015-03-08 | Disposition: A | Discharge status: Home / Self Care | Payer: Medicare HMO | Source: Ambulatory Visit | Attending: Radiation Oncology | Admitting: Radiation Oncology

## 2015-03-08 DIAGNOSIS — C61 Malignant neoplasm of prostate: Secondary | ICD-10-CM | POA: Diagnosis not present

## 2015-03-11 ENCOUNTER — Encounter: Payer: Self-pay | Admitting: Radiation Oncology

## 2015-03-11 ENCOUNTER — Ambulatory Visit
Admission: RE | Admit: 2015-03-11 | Discharge: 2015-03-11 | Disposition: A | Discharge status: Home / Self Care | Payer: Medicaid Other | Source: Ambulatory Visit | Attending: Radiation Oncology | Admitting: Radiation Oncology

## 2015-03-11 ENCOUNTER — Ambulatory Visit
Admission: RE | Admit: 2015-03-11 | Discharge: 2015-03-11 | Disposition: A | Discharge status: Home / Self Care | Payer: Medicare HMO | Source: Ambulatory Visit | Attending: Radiation Oncology | Admitting: Radiation Oncology

## 2015-03-11 VITALS — BP 149/86 | HR 72 | Temp 97.6°F | Ht 63.0 in | Wt 164.2 lb

## 2015-03-11 DIAGNOSIS — C61 Malignant neoplasm of prostate: Secondary | ICD-10-CM

## 2015-03-11 NOTE — Progress Notes (Signed)
Vincent [name] continues to state that he has intermittent stinging when voiding, mild dribbling at end of stream and nocturia 6 times on average.  Denies any straining when voiding with stop and start stream x 1. He also reports occasional rectal burning upon defecation, but denies any loose nor diarrheal stools. He admits to fatigue and naps, more than his normal, during the day.

## 2015-03-11 NOTE — Progress Notes (Signed)
Weekly Management Note:  Site: prostate/pelvic lymph nodes Current Dose:   5460  cGy Projected Dose:  7800  cGy  Narrative: The patient is seen today for routine under treatment assessment. CBCT/MVCT images/port films were reviewed. The chart was reviewed.    Bladder filling is satisfactory but not ideal. He feels as if his bladder is full before each treatment. He does have frequent nocturia but this is tolerable. He continues with tamsulosin.  Physical Examination:  Filed Vitals:   03/11/15 0929  BP: 149/86  Pulse: 72  Temp: 97.6 F (36.4 C)  .  Weight: 164 lb 3.2 oz (74.481 kg).  No change.  Impression: Tolerating radiation therapy well.  Plan: Continue radiation therapy as planned.

## 2015-03-12 ENCOUNTER — Ambulatory Visit
Admission: RE | Admit: 2015-03-12 | Discharge: 2015-03-12 | Disposition: A | Discharge status: Home / Self Care | Payer: Medicare HMO | Source: Ambulatory Visit | Attending: Radiation Oncology | Admitting: Radiation Oncology

## 2015-03-12 DIAGNOSIS — C61 Malignant neoplasm of prostate: Secondary | ICD-10-CM | POA: Diagnosis not present

## 2015-03-13 ENCOUNTER — Ambulatory Visit
Admission: RE | Admit: 2015-03-13 | Discharge: 2015-03-13 | Disposition: A | Discharge status: Home / Self Care | Payer: Medicare HMO | Source: Ambulatory Visit | Attending: Radiation Oncology | Admitting: Radiation Oncology

## 2015-03-13 DIAGNOSIS — C61 Malignant neoplasm of prostate: Secondary | ICD-10-CM | POA: Diagnosis not present

## 2015-03-14 ENCOUNTER — Ambulatory Visit
Admission: RE | Admit: 2015-03-14 | Discharge: 2015-03-14 | Disposition: A | Discharge status: Home / Self Care | Payer: Medicare HMO | Source: Ambulatory Visit | Attending: Radiation Oncology | Admitting: Radiation Oncology

## 2015-03-14 DIAGNOSIS — C61 Malignant neoplasm of prostate: Secondary | ICD-10-CM | POA: Diagnosis not present

## 2015-03-15 ENCOUNTER — Ambulatory Visit
Admission: RE | Admit: 2015-03-15 | Discharge: 2015-03-15 | Disposition: A | Discharge status: Home / Self Care | Payer: Medicare HMO | Source: Ambulatory Visit | Attending: Radiation Oncology | Admitting: Radiation Oncology

## 2015-03-15 DIAGNOSIS — C61 Malignant neoplasm of prostate: Secondary | ICD-10-CM | POA: Diagnosis not present

## 2015-03-18 ENCOUNTER — Ambulatory Visit
Admission: RE | Admit: 2015-03-18 | Discharge: 2015-03-18 | Disposition: A | Discharge status: Home / Self Care | Payer: Medicare HMO | Source: Ambulatory Visit | Attending: Radiation Oncology | Admitting: Radiation Oncology

## 2015-03-18 VITALS — BP 143/86 | HR 73 | Temp 98.1°F | Ht 63.0 in | Wt 164.1 lb

## 2015-03-18 DIAGNOSIS — C61 Malignant neoplasm of prostate: Secondary | ICD-10-CM | POA: Diagnosis not present

## 2015-03-18 NOTE — Progress Notes (Signed)
Mr. Glidden has received 33 fractions to for prostate.  He reports "straining a liitle bit" and has intermittent burning upon urination with nocturia more than 5 times nightly.  He denies any dribbling.  He denies any rectal irritation, nor loose/diarrheal stools.

## 2015-03-18 NOTE — Progress Notes (Signed)
Weekly Management Note:  Site: Prostate/pelvic lymph nodes Current Dose:  6435  cGy Projected Dose: 7800  cGy  Narrative: The patient is seen today for routine under treatment assessment. CBCT/MVCT images/port films were reviewed. The chart was reviewed.   Bladder filling is satisfactory.  He continues on tamsulosin.  He is straining will be more and continues to have nocturia 5.  No rectal or other GI symptoms.  Physical Examination:  Filed Vitals:   03/18/15 1001  BP: 143/86  Pulse: 73  Temp: 98.1 F (36.7 C)  .  Weight: 164 lb 1.6 oz (74.435 kg).  No change.  Impression: Tolerating radiation therapy well.  Plan: Continue radiation therapy as planned.

## 2015-03-19 ENCOUNTER — Ambulatory Visit
Admission: RE | Admit: 2015-03-19 | Discharge: 2015-03-19 | Disposition: A | Discharge status: Home / Self Care | Payer: Medicare HMO | Source: Ambulatory Visit | Attending: Radiation Oncology | Admitting: Radiation Oncology

## 2015-03-19 DIAGNOSIS — C61 Malignant neoplasm of prostate: Secondary | ICD-10-CM | POA: Diagnosis not present

## 2015-03-20 ENCOUNTER — Ambulatory Visit
Admission: RE | Admit: 2015-03-20 | Discharge: 2015-03-20 | Disposition: A | Discharge status: Home / Self Care | Payer: Medicare HMO | Source: Ambulatory Visit | Attending: Radiation Oncology | Admitting: Radiation Oncology

## 2015-03-20 DIAGNOSIS — C61 Malignant neoplasm of prostate: Secondary | ICD-10-CM | POA: Diagnosis not present

## 2015-03-21 ENCOUNTER — Ambulatory Visit
Admission: RE | Admit: 2015-03-21 | Discharge: 2015-03-21 | Disposition: A | Discharge status: Home / Self Care | Payer: Medicare HMO | Source: Ambulatory Visit | Attending: Radiation Oncology | Admitting: Radiation Oncology

## 2015-03-21 DIAGNOSIS — C61 Malignant neoplasm of prostate: Secondary | ICD-10-CM | POA: Diagnosis not present

## 2015-03-22 ENCOUNTER — Ambulatory Visit
Admission: RE | Admit: 2015-03-22 | Discharge: 2015-03-22 | Disposition: A | Discharge status: Home / Self Care | Payer: Medicare HMO | Source: Ambulatory Visit | Attending: Radiation Oncology | Admitting: Radiation Oncology

## 2015-03-22 DIAGNOSIS — C61 Malignant neoplasm of prostate: Secondary | ICD-10-CM | POA: Diagnosis not present

## 2015-03-25 ENCOUNTER — Encounter: Payer: Self-pay | Admitting: Radiation Oncology

## 2015-03-25 ENCOUNTER — Ambulatory Visit
Admission: RE | Admit: 2015-03-25 | Discharge: 2015-03-25 | Disposition: A | Discharge status: Home / Self Care | Payer: Medicare HMO | Source: Ambulatory Visit | Attending: Radiation Oncology | Admitting: Radiation Oncology

## 2015-03-25 ENCOUNTER — Ambulatory Visit
Admission: RE | Admit: 2015-03-25 | Discharge: 2015-03-25 | Disposition: A | Discharge status: Home / Self Care | Payer: Medicaid Other | Source: Ambulatory Visit | Attending: Radiation Oncology | Admitting: Radiation Oncology

## 2015-03-25 VITALS — BP 127/83 | HR 74 | Temp 98.3°F | Ht 63.0 in | Wt 164.7 lb

## 2015-03-25 DIAGNOSIS — C61 Malignant neoplasm of prostate: Secondary | ICD-10-CM | POA: Diagnosis not present

## 2015-03-25 NOTE — Progress Notes (Signed)
Vincent [name]  Has received 38 fractions to his pelvis for prostate cancer.  He reports  has intermittent burning upon urination with nocturia more than 5 times nightly. He denies any dribbling. He denies any rectal irritation, nor loose/diarrheal stools.

## 2015-03-25 NOTE — Progress Notes (Signed)
Weekly Management Note:  Site: Prostate/pelvic lymph nodes Current Dose:  7410  cGy Projected Dose: 7800  cGy  Narrative: The patient is seen today for routine under treatment assessment. CBCT/MVCT images/port films were reviewed. The chart was reviewed.   Bladder filling is satisfactory.  He does have nocturia 5 which is tolerable.  He also has intermittent dysuria as expected.  No diarrhea.  He finishes his radiation therapy this Wednesday.  Physical Examination:  Filed Vitals:   03/25/15 1001  BP: 127/83  Pulse: 74  Temp: 98.3 F (36.8 C)  .  Weight: 164 lb 11.2 oz (74.707 kg).  No change.  Impression: Tolerating radiation therapy well.  Plan: Continue radiation therapy as planned.  One-month follow-up after completion of radiation therapy.

## 2015-03-26 ENCOUNTER — Ambulatory Visit
Admission: RE | Admit: 2015-03-26 | Discharge: 2015-03-26 | Disposition: A | Discharge status: Home / Self Care | Payer: Medicare HMO | Source: Ambulatory Visit | Attending: Radiation Oncology | Admitting: Radiation Oncology

## 2015-03-26 DIAGNOSIS — C61 Malignant neoplasm of prostate: Secondary | ICD-10-CM | POA: Diagnosis not present

## 2015-03-27 ENCOUNTER — Ambulatory Visit
Admission: RE | Admit: 2015-03-27 | Discharge: 2015-03-27 | Disposition: A | Discharge status: Home / Self Care | Payer: Medicare HMO | Source: Ambulatory Visit | Attending: Radiation Oncology | Admitting: Radiation Oncology

## 2015-03-27 ENCOUNTER — Encounter: Payer: Self-pay | Admitting: Radiation Oncology

## 2015-03-27 DIAGNOSIS — C61 Malignant neoplasm of prostate: Secondary | ICD-10-CM | POA: Diagnosis not present

## 2015-03-27 NOTE — Progress Notes (Signed)
[facility] End of Treatment Note  Name:Zeven Dalton  Date: 03/27/2015 HUT:[id number] DOB:1941-04-23   Status:outpatient    CC: Dr. Festus Aloe   REFERRING PHYSICIAN:  Dr. Festus Aloe     DIAGNOSIS:  Stage T2c versus T3a high risk adenocarcinoma prostate   INDICATION FOR TREATMENT: Curative   TREATMENT DATES: 01/30/2015 through 03/27/2015                          SITE/DOSE: Prostate 7800 cGy, and seminal vesical/pelvic lymph nodes 5600 cGy, both in 40 sessions                           BEAMS/ENERGY:  Dual ARC VMAT IMRT with 6 MV photons                 NARRATIVE:   Mr. Brosh tolerated his treatment reasonably well although he had worsening nocturia and obstructive symptoms midway through his course of therapy.  He was started on tamsulosin with some benefit.  No GI difficulties.                         PLAN: Routine followup in one month. Patient instructed to call if questions or worsening complaints in interim.

## 2015-04-03 ENCOUNTER — Encounter: Payer: Self-pay | Admitting: Medical Oncology

## 2015-04-03 NOTE — Progress Notes (Signed)
Oncology Nurse Navigator Documentation  Oncology Nurse Navigator Flowsheets 02/26/2015 03/04/2015 04/03/2015  Navigator Encounter Type Treatment Treatment Telephone  Patient Visit Type Radonc Radonc Follow-up  Treatment Phase Treatment Treatment Final Radiation Tx- I was out of office last day of treatment but wanted to follow up with Mr. Repinski. He states he is doing well and that Dr. Valere Dross wants to see him in a month. He currently does not have an appointment scheduled. I will contact Santiago Glad and follow up with the patient with date and time. He voiced understanding.  Barriers/Navigation Needs - - No barriers at this time  Education - - -  Interventions - - Coordination of Care- MD follow up appointment with Dr. Valere Dross  Coordination of Care - - MD Appointments  Education Method - - -  Support Groups/Services Friends and Family Friends and Family Friends and Family  Time Spent with Patient 15 - 65

## 2015-05-21 ENCOUNTER — Encounter: Payer: Self-pay | Admitting: Radiation Oncology

## 2015-05-23 ENCOUNTER — Ambulatory Visit
Admission: RE | Admit: 2015-05-23 | Payer: Commercial Managed Care - HMO | Source: Ambulatory Visit | Admitting: Radiation Oncology

## 2015-05-23 ENCOUNTER — Ambulatory Visit
Admission: RE | Admit: 2015-05-23 | Discharge: 2015-05-23 | Disposition: A | Discharge status: Home / Self Care | Payer: Commercial Managed Care - HMO | Source: Ambulatory Visit | Attending: Radiation Oncology | Admitting: Radiation Oncology

## 2015-05-23 HISTORY — DX: Personal history of irradiation: Z92.3

## 2015-05-24 ENCOUNTER — Telehealth: Payer: Self-pay | Admitting: Medical Oncology

## 2015-05-24 NOTE — Telephone Encounter (Signed)
Oncology Nurse Navigator Documentation  Oncology Nurse Navigator Flowsheets 03/04/2015 04/03/2015 05/24/2015  Navigator Encounter Type Treatment Telephone Telephone- I called Mr. Malburg to inquire about his missed appointment with Dr. Valere Dross yesterday. He states he did not think he has to come back here only to [facility] to get his ADT. I explained to him this is a  3 month follow up and after this appointment he will only need to follow up with Dr. Junious Silk. I will ask Santiago Glad to call him to reschedule this appointment. He voiced understanding. I asked him to call me with any questions or concerns. I spoke with Santiago Glad in Radiation scheduling and she will contact the patient.  Patient Visit Type Radonc Follow-up -  Treatment Phase Treatment Final Radiation Tx -  Barriers/Navigation Needs - No barriers at this time -  Education - - -  Interventions - Coordination of Care Coordination of Care  Coordination of Care - MD Appointments MD Appointments  Education Method - - -  Support Groups/Services Friends and Family Friends and Family -  Time Spent with Patient - 91 15

## 2015-06-13 ENCOUNTER — Telehealth: Payer: Self-pay | Admitting: Medical Oncology

## 2015-06-13 NOTE — Telephone Encounter (Signed)
Oncology Nurse Navigator Documentation  Oncology Nurse Navigator Flowsheets 04/03/2015 05/24/2015 06/13/2015  Navigator Encounter Type Telephone Telephone Telephone  Telephone - - Incoming Call;Appt Confirmation/Clarification  Patient Visit Type Follow-up - -  Treatment Phase Final Radiation Tx - -  Barriers/Navigation Needs No barriers at this time - -  Education - - -  Interventions Coordination of Care Coordination of Care Coordination of [place] daughter called stating that she has to work on 1/19 the day her dad has an appointment. She provides transportation for her dad and would like to know if he can be seen Friday. I spoke with Earleen Newport and she will be in contact with [facility] regarding rescheduling this appointment.  Coordination of Care MD Appointments MD Appointments Appts  Education Method - - -  Support Groups/Services Friends and Family - -  Time Spent with Patient [id number]

## 2015-06-20 ENCOUNTER — Ambulatory Visit
Admission: RE | Admit: 2015-06-20 | Discharge: 2015-06-20 | Disposition: A | Discharge status: Home / Self Care | Payer: Commercial Managed Care - HMO | Source: Ambulatory Visit | Attending: Radiation Oncology | Admitting: Radiation Oncology

## 2015-06-20 ENCOUNTER — Encounter: Payer: Self-pay | Admitting: Radiation Oncology

## 2015-06-20 ENCOUNTER — Encounter: Payer: Self-pay | Admitting: Medical Oncology

## 2015-06-20 VITALS — BP 118/77 | HR 89 | Temp 97.8°F | Resp 20 | Wt 166.7 lb

## 2015-06-20 DIAGNOSIS — C61 Malignant neoplasm of prostate: Secondary | ICD-10-CM

## 2015-06-20 NOTE — Progress Notes (Signed)
Follow up s/p radiation to the prostate8/31/16-10/26/16, takes flomax daily, has noctutia x4,  , good stream, no hematuria , , c/o constipation suggested OTC miralax or prune juice to drink more water also 9:15 AM BP 118/77 mmHg  Pulse 89  Temp(Src) 97.8 F (36.6 C) (Oral)  Resp 20  Wt 166 lb 11.2 oz (75.615 kg)  Wt Readings from Last 3 Encounters:  06/20/15 166 lb 11.2 oz (75.615 kg)  03/25/15 164 lb 11.2 oz (74.707 kg)  03/18/15 164 lb 1.6 oz (74.435 kg)

## 2015-06-20 NOTE — Progress Notes (Signed)
Oncology Nurse Navigator Documentation  Oncology Nurse Navigator Flowsheets 05/24/2015 06/13/2015 06/20/2015  Navigator Location - - [facility]  Navigator Encounter Type Telephone Telephone Follow-up Appt  Telephone - Incoming Call;Appt Confirmation/Clarification -  Patient Visit Type - - RadOnc  Treatment Phase - - -  Barriers/Navigation Needs - - Education- Vincent [name] states he is doing well except he is having issues with constipation. We discussed OTC medications as stool softeners and Miralax and to increase his water intake. He states that he has been having some shortness of breath.He states that this is new. Dr. Tammi Klippel does not feel this is related to his prostate cancer and needs to follow up with his primary care to be evaluated.The shortness of breath could be related to other health issues. I stressed this to patient and his daughter Vincent [name] Patient to call and get an appointment with his primary care MD. She and the patient voiced understanding of the above.   Education - - Other  Interventions Coordination of Care Coordination of Care Education Method  Coordination of Care MD Appointments Appts -  Education Method - - Teach-back;Verbal  Support Groups/Services - - Friends and Family  Acuity - - Level 1  Acuity Level 1 - - Minimal follow up required  Time Spent with Patient 15 15 30

## 2015-06-20 NOTE — Progress Notes (Signed)
Radiation Oncology         [phone] ________________________________  Name: Vincent [name] MRN: [id number]  Date: 06/20/2015  DOB: [birth year]  Follow-Up Visit Note  CC: No primary care provider on file.  Festus Aloe, MD  Diagnosis:   Vincent [name] is a 75 y.o gentleman with Stage T2c vs T3a prostate cancer with a Gleasons score of 4+3 and PSA of 39.35.    Interval Since Last Radiation:  2 months; 01/30/15-03/27/15  Narrative:  The patient returns today for routine follow-up.  Patient takes flomax daily. Reports noctutia x4 and constipation. Reports having a good stream. Patient denies hematuria. Nurse suggested OTC miralax or prune juice as well as increased water intake. Patient had PSA of 0.86 on 05/02/15. Patient reports fatigue and SOB. Suggested that patient discuss SOB with primary care physician.                         ALLERGIES:  has No Known Allergies.  Meds: Current Outpatient Prescriptions  Medication Sig Dispense Refill  . latanoprost (XALATAN) 0.005 % ophthalmic solution INSTILL 1 DROP IN BOTH EYES EXACTLY ONCE A DAY AT BED TIME.  4  . megestrol (MEGACE) 20 MG tablet TAKE 1 TABLET BY MOUTH TWICE DAILY AS NEEDED FOR HOT FLASHES.  2  . tamsulosin (FLOMAX) 0.4 MG CAPS capsule Take 1 capsule (0.4 mg total) by mouth daily. 30 capsule 3   No current facility-administered medications for this encounter.    Physical Findings: The patient is in no acute distress. Patient is alert and oriented.  weight is 166 lb 11.2 oz (75.615 kg). His oral temperature is 97.8 F (36.6 C). His blood pressure is 118/77 and his pulse is 89. His respiration is 20. Marland Kitchen  No significant changes.   Lab Findings: Lab Results  Component Value Date   WBC 6.7 09/23/2012   HGB 12.1* 09/23/2012   HCT 37.3* 09/23/2012   PLT 268.0 09/23/2012    Lab Results  Component Value Date   NA 135 07/31/2012   K 3.5 07/31/2012   CO2 25 07/31/2012   GLUCOSE 121* 07/31/2012   BUN 18 07/31/2012   CREATININE 0.88 07/31/2012   BILITOT 0.5 07/31/2012   ALKPHOS 73 07/31/2012   AST 15 07/31/2012   ALT 15 07/31/2012   PROT 7.3 07/31/2012   ALBUMIN 3.3* 07/31/2012   CALCIUM 8.7 07/31/2012    Radiographic Findings: No results found.  Impression:  The patient is recovering from the effects of radiation.   Plan:  He will continue to follow-up with urology for ongoing PSA determinations.  I will look forward to following his response through their correspondence, and be happy to participate in care if clinically indicated.  I talked to the patient about what to expect in the future, including his risk for erectile dysfunction and rectal bleeding.  I encouraged him to call or return to the office if he has any question about his previous radiation or possible radiation effects.  He was comfortable with this plan.  ------------------------------------------------   Tyler Pita, MD [place] Director and Director of Stereotactic Radiosurgery Direct Dial: [phone]  Fax: [phone] [email or web address]  Skype  LinkedIn    This document serves as a record of services personally performed by Tyler Pita, MD. It was created on his behalf by Jenell Milliner, a trained medical scribe. The creation of this record is based on the scribe's personal observations and the provider's statements to  them. This document has been checked and approved by the attending provider.

## 2015-08-30 DIAGNOSIS — H18413 Arcus senilis, bilateral: Secondary | ICD-10-CM | POA: Diagnosis not present

## 2015-08-30 DIAGNOSIS — H401111 Primary open-angle glaucoma, right eye, mild stage: Secondary | ICD-10-CM | POA: Diagnosis not present

## 2015-08-30 DIAGNOSIS — H11423 Conjunctival edema, bilateral: Secondary | ICD-10-CM | POA: Diagnosis not present

## 2015-08-30 DIAGNOSIS — H2513 Age-related nuclear cataract, bilateral: Secondary | ICD-10-CM | POA: Diagnosis not present

## 2015-08-30 DIAGNOSIS — H11153 Pinguecula, bilateral: Secondary | ICD-10-CM | POA: Diagnosis not present

## 2015-08-30 DIAGNOSIS — H401122 Primary open-angle glaucoma, left eye, moderate stage: Secondary | ICD-10-CM | POA: Diagnosis not present

## 2015-09-20 DIAGNOSIS — H04123 Dry eye syndrome of bilateral lacrimal glands: Secondary | ICD-10-CM | POA: Diagnosis not present

## 2015-09-20 DIAGNOSIS — H18413 Arcus senilis, bilateral: Secondary | ICD-10-CM | POA: Diagnosis not present

## 2015-09-20 DIAGNOSIS — H11153 Pinguecula, bilateral: Secondary | ICD-10-CM | POA: Diagnosis not present

## 2015-09-20 DIAGNOSIS — H1789 Other corneal scars and opacities: Secondary | ICD-10-CM | POA: Diagnosis not present

## 2015-09-20 DIAGNOSIS — H2513 Age-related nuclear cataract, bilateral: Secondary | ICD-10-CM | POA: Diagnosis not present

## 2015-09-20 DIAGNOSIS — H11423 Conjunctival edema, bilateral: Secondary | ICD-10-CM | POA: Diagnosis not present

## 2015-09-20 DIAGNOSIS — H401111 Primary open-angle glaucoma, right eye, mild stage: Secondary | ICD-10-CM | POA: Diagnosis not present

## 2015-09-20 DIAGNOSIS — H401122 Primary open-angle glaucoma, left eye, moderate stage: Secondary | ICD-10-CM | POA: Diagnosis not present

## 2015-09-20 DIAGNOSIS — H25013 Cortical age-related cataract, bilateral: Secondary | ICD-10-CM | POA: Diagnosis not present

## 2015-10-01 DIAGNOSIS — Z136 Encounter for screening for cardiovascular disorders: Secondary | ICD-10-CM | POA: Diagnosis not present

## 2015-10-01 DIAGNOSIS — Z720 Tobacco use: Secondary | ICD-10-CM | POA: Diagnosis not present

## 2015-10-01 DIAGNOSIS — I10 Essential (primary) hypertension: Secondary | ICD-10-CM | POA: Diagnosis not present

## 2015-10-01 DIAGNOSIS — Z01118 Encounter for examination of ears and hearing with other abnormal findings: Secondary | ICD-10-CM | POA: Diagnosis not present

## 2015-10-01 DIAGNOSIS — E785 Hyperlipidemia, unspecified: Secondary | ICD-10-CM | POA: Diagnosis not present

## 2015-10-01 DIAGNOSIS — Z8669 Personal history of other diseases of the nervous system and sense organs: Secondary | ICD-10-CM | POA: Diagnosis not present

## 2015-10-01 DIAGNOSIS — H538 Other visual disturbances: Secondary | ICD-10-CM | POA: Diagnosis not present

## 2015-10-01 DIAGNOSIS — Z131 Encounter for screening for diabetes mellitus: Secondary | ICD-10-CM | POA: Diagnosis not present

## 2015-10-01 DIAGNOSIS — E559 Vitamin D deficiency, unspecified: Secondary | ICD-10-CM | POA: Diagnosis not present

## 2015-10-01 DIAGNOSIS — Z0000 Encounter for general adult medical examination without abnormal findings: Secondary | ICD-10-CM | POA: Diagnosis not present

## 2015-10-01 DIAGNOSIS — I119 Hypertensive heart disease without heart failure: Secondary | ICD-10-CM | POA: Diagnosis not present

## 2015-10-14 DIAGNOSIS — E559 Vitamin D deficiency, unspecified: Secondary | ICD-10-CM | POA: Diagnosis not present

## 2015-10-14 DIAGNOSIS — I119 Hypertensive heart disease without heart failure: Secondary | ICD-10-CM | POA: Diagnosis not present

## 2015-10-14 DIAGNOSIS — I10 Essential (primary) hypertension: Secondary | ICD-10-CM | POA: Diagnosis not present

## 2015-10-14 DIAGNOSIS — E785 Hyperlipidemia, unspecified: Secondary | ICD-10-CM | POA: Diagnosis not present

## 2015-10-14 DIAGNOSIS — N400 Enlarged prostate without lower urinary tract symptoms: Secondary | ICD-10-CM | POA: Diagnosis not present

## 2015-10-14 DIAGNOSIS — E119 Type 2 diabetes mellitus without complications: Secondary | ICD-10-CM | POA: Diagnosis not present

## 2015-10-14 DIAGNOSIS — Z720 Tobacco use: Secondary | ICD-10-CM | POA: Diagnosis not present

## 2015-10-21 DIAGNOSIS — H401133 Primary open-angle glaucoma, bilateral, severe stage: Secondary | ICD-10-CM | POA: Diagnosis not present

## 2015-10-21 DIAGNOSIS — H25813 Combined forms of age-related cataract, bilateral: Secondary | ICD-10-CM | POA: Diagnosis not present

## 2015-10-21 DIAGNOSIS — H43812 Vitreous degeneration, left eye: Secondary | ICD-10-CM | POA: Diagnosis not present

## 2015-11-14 DIAGNOSIS — H268 Other specified cataract: Secondary | ICD-10-CM | POA: Diagnosis not present

## 2015-11-14 DIAGNOSIS — H401122 Primary open-angle glaucoma, left eye, moderate stage: Secondary | ICD-10-CM | POA: Diagnosis not present

## 2015-11-14 DIAGNOSIS — H2512 Age-related nuclear cataract, left eye: Secondary | ICD-10-CM | POA: Diagnosis not present

## 2017-05-03 DIAGNOSIS — R351 Nocturia: Secondary | ICD-10-CM | POA: Diagnosis not present

## 2017-05-03 DIAGNOSIS — C61 Malignant neoplasm of prostate: Secondary | ICD-10-CM | POA: Diagnosis not present

## 2018-07-13 DIAGNOSIS — C61 Malignant neoplasm of prostate: Secondary | ICD-10-CM | POA: Diagnosis not present

## 2018-07-13 DIAGNOSIS — R351 Nocturia: Secondary | ICD-10-CM | POA: Diagnosis not present

## 2018-07-20 ENCOUNTER — Ambulatory Visit (INDEPENDENT_AMBULATORY_CARE_PROVIDER_SITE_OTHER): Payer: Medicare HMO | Admitting: Internal Medicine

## 2018-07-20 ENCOUNTER — Other Ambulatory Visit: Payer: Self-pay

## 2018-07-20 VITALS — BP 122/67 | HR 73 | Temp 97.9°F | Ht 63.0 in | Wt 174.4 lb

## 2018-07-20 DIAGNOSIS — Z87891 Personal history of nicotine dependence: Secondary | ICD-10-CM | POA: Diagnosis not present

## 2018-07-20 DIAGNOSIS — Z550 Illiteracy and low-level literacy: Secondary | ICD-10-CM | POA: Diagnosis not present

## 2018-07-20 DIAGNOSIS — R6889 Other general symptoms and signs: Secondary | ICD-10-CM | POA: Insufficient documentation

## 2018-07-20 DIAGNOSIS — R42 Dizziness and giddiness: Secondary | ICD-10-CM | POA: Diagnosis not present

## 2018-07-20 DIAGNOSIS — Z923 Personal history of irradiation: Secondary | ICD-10-CM

## 2018-07-20 DIAGNOSIS — C61 Malignant neoplasm of prostate: Secondary | ICD-10-CM

## 2018-07-20 DIAGNOSIS — R413 Other amnesia: Secondary | ICD-10-CM

## 2018-07-20 DIAGNOSIS — Z8546 Personal history of malignant neoplasm of prostate: Secondary | ICD-10-CM

## 2018-07-20 DIAGNOSIS — R0609 Other forms of dyspnea: Secondary | ICD-10-CM | POA: Insufficient documentation

## 2018-07-20 DIAGNOSIS — H547 Unspecified visual loss: Secondary | ICD-10-CM

## 2018-07-20 DIAGNOSIS — E785 Hyperlipidemia, unspecified: Secondary | ICD-10-CM | POA: Diagnosis not present

## 2018-07-20 DIAGNOSIS — R0600 Dyspnea, unspecified: Secondary | ICD-10-CM | POA: Diagnosis not present

## 2018-07-20 DIAGNOSIS — Z8711 Personal history of peptic ulcer disease: Secondary | ICD-10-CM | POA: Diagnosis not present

## 2018-07-20 DIAGNOSIS — Z87898 Personal history of other specified conditions: Secondary | ICD-10-CM

## 2018-07-20 DIAGNOSIS — Z9221 Personal history of antineoplastic chemotherapy: Secondary | ICD-10-CM

## 2018-07-20 DIAGNOSIS — K2971 Gastritis, unspecified, with bleeding: Secondary | ICD-10-CM | POA: Diagnosis not present

## 2018-07-20 DIAGNOSIS — Z8639 Personal history of other endocrine, nutritional and metabolic disease: Secondary | ICD-10-CM

## 2018-07-20 NOTE — Assessment & Plan Note (Addendum)
Patient was previously told he had "prediabetes".  He stopped taking all of his medications 3 years ago.  He has a bag full of old prescriptions, was previously taking metformin 500 mg half tablet twice daily.  --Follow-up hemoglobin A1c  ADDENDUM: Unfortunately A1c not ordered and patient left prior to me catching this. Glucose on BMP however was only 104.

## 2018-07-20 NOTE — Progress Notes (Signed)
   CC: Memory loss   HPI:  Vincent [name] is a 78 y.o. male with past medical history outlined below here for memory loss and to establish care. For the details of today's visit, please refer to the assessment and plan.  Past Medical History:  Diagnosis Date  . GI bleed   . H. pylori infection   . Iliac artery stenosis, right ([place])    90% by CTA 05/27/2010  . Prostate cancer ([place]) 02/08/2013  . Right hydrocele   . S/P radiation therapy 01/30/2015 through 03/27/2015    Prostate 7800 cGy, and seminal vesical/pelvic lymph nodes 5600 cGy, both in 40 sessions   . Transfusion history    Secondary to GIB 05/2010  . Ulcer disease    Gastric Ulcer    Family History: Mother had breast cancer, brother with alcohol use disorder and ?cancer (deceased)  Social History: Quit drinking 15 years ago after a DUI. Quit smoking cigarettes 6-7 years ago (previously 1/2 PPD for 20+ years).   Review of Systems  Constitutional: Negative for chills and fever.  HENT: Negative for congestion and sore throat.   Eyes: Positive for blurred vision. Negative for pain.  Respiratory: Positive for shortness of breath. Negative for cough and wheezing.   Cardiovascular: Negative for chest pain and orthopnea.  Gastrointestinal: Negative for abdominal pain, blood in stool and melena.  Genitourinary: Negative for dysuria and urgency.  Musculoskeletal: Negative for back pain and falls.  Skin: Negative for itching and rash.  Neurological: Positive for dizziness. Negative for focal weakness.  Psychiatric/Behavioral: Negative for depression and substance abuse.    Physical Exam:  Vitals:   07/20/18 0855  BP: 122/67  Pulse: 73  Temp: 97.9 F (36.6 C)  TempSrc: Oral  SpO2: 98%  Weight: 174 lb 6.4 oz (79.1 kg)  Height: 5\' 3"  (1.6 m)    Constitutional: NAD, appears comfortable HEENT: PERRL, NCAT Neck: Supple, trachea midline. No  lymphadenopathy.  Cardiovascular: RRR, no m/r/g Pulmonary/Chest: CTAB, no wheezes, rales, or rhonchi.  Abdominal: Soft, non tender, non distended. +BS.  Extremities: Warm and well perfused. No edema.  Neurological: A&Ox3, CN II - XII grossly intact.  Skin: No rashes or erythema  Psychiatric: Normal mood and affect  Assessment & Plan:   See Encounters Tab for problem based charting.  Patient discussed with Dr. Eppie Gibson

## 2018-07-20 NOTE — Assessment & Plan Note (Signed)
Patient has a history of prostate cancer for which he completed radiation and chemotherapy 2 years ago.  He is unsure of the specifics of his treatment and we do not have records in our system.  He reports symptoms of night sweats and hot flashes and was previously prescribed Megace.  He stopped taking this medication about 1 year ago.  He continues to follow-up with urology and had an appointment a few days ago.  Will obtain records.

## 2018-07-20 NOTE — Patient Instructions (Addendum)
Vincent [name],  It was a pleasure to meet you. I will call you with the results of your blood work.  I have ordered breathing testing for your shortness of breath. You will be contacted to schedule this.   I have placed an order for a home health aid and physical therapist. Dennis Bast will be contacted by a home health agency in the next couple of days to arrange a visit.   Please follow up with [place] again in 1 month or sooner if you have any problems. If you have any questions or concerns, call our clinic at [phone] or after hours call [phone] and ask for the internal medicine resident on call. Thank you!  Dr. Philipp Ovens

## 2018-07-20 NOTE — Assessment & Plan Note (Signed)
Patient is complaining of dyspnea on exertion that has progressively worsened over the past few months.  Daughter reports that patient is unable to walk more than a few steps before getting short of breath and needing to sit down to rest.  Patient is a former chronic smoker, quit smoking 6 years ago.  Previously had a 20+ year smoking history.  He has no history of cardiovascular disease.  No signs or other symptoms concerning for heart failure.  Will start with spirometry to evaluate for obstructive lung disease. -- follow up PFTs

## 2018-07-20 NOTE — Assessment & Plan Note (Addendum)
Patient is here today with his daughter who was concern for worsening memory loss.  He has been having issues with his memory now for many years, however was previously mild and has progressed over the past few months.  Patient reports incidences of walking to the fridge, standing there for a long period of time and being unable to recall what he had gotten up to get. Daughter reports he is also having difficulty remembering names of family members including herself. He has also forgotten about food he was cooking that ended up burning on the stove.  Patient does not drive.  He lost his license 15 years ago after a DUI and has not driven since.  He has been sober since that time.  His daughter has managed all of his finances for the past 10 years due to patient being illiterate and having poor vision.  Patient dropped out of school in the third grade and never learned how to read.  Difficult to assess impact of memory on functional status as he has not been independent in his iADLs for some time.  He remains functional with his ADLs.  He is able to bathe, dress, toilet, and feed himself without problems.  However meal prepping and cooking has recently become a safety concern.  Daughter is requesting a home health personal aide.  Patient did score a 12 on his PHQ 9 today, however daughter and patient both deny significant depression.  On review of his answers, he mainly scored in areas of fatigue, difficulty sleeping, and concentration. Although patient has a remote history of alcohol use disorder, patient does not appear malnourished and he is able to ambulate without ataxia. Gait appears normal however he reports feeling unsteady on his feet. Doubt wernicke's encephalopathy. MOCA was unable to be performed today due to patient's poor vision and being illiterate.  I suspect that patient has some degree of dementia but this is difficult to quantify after a single visit.  We will hold off on initiating any therapy for  now and have patient follow-up closely. --Orders for home health personal aide placed --Follow up Vit B12 and TSH  -- Follow up 1 month   ADDENDUM: Vit B12 and TSH normal. Left VM with results.   ADDENDUM: Changed home health order to include physical therapy given deconditioning and fall risk.

## 2018-07-20 NOTE — Assessment & Plan Note (Addendum)
Patient has a history of GI bleed in 2014 requiring hospitalization secondary to peptic ulcer disease from H. pylori infection.  He denies any further episodes of bleeding or abdominal pain.  He is not on a PPI. -Continue to monitor  ADDENDUM: Hemoglobin stable, 13.7.

## 2018-07-20 NOTE — Assessment & Plan Note (Addendum)
Patient stopped taking all medications 3 years ago including his statin.  He has no history of cardiovascular disease and blood pressure today is normal.  Suspect his 10-year ASCVD risk is low, but will check lipid panel in the setting of questionable diabetes.  If LDL is significantly elevated, will need to discuss the risks and benefits of statin therapy for primary prevention in the setting of his age.  ADDENDUM: LDL elevated, 164. Will defer initiation of statin to PCP after discussion with patient about primary prevention.

## 2018-07-21 LAB — BMP8+ANION GAP
ANION GAP: 21 mmol/L — AB (ref 10.0–18.0)
BUN / CREAT RATIO: 9 — AB (ref 10–24)
BUN: 11 mg/dL (ref 8–27)
CHLORIDE: 98 mmol/L (ref 96–106)
CO2: 21 mmol/L (ref 20–29)
Calcium: 9.4 mg/dL (ref 8.6–10.2)
Creatinine, Ser: 1.2 mg/dL (ref 0.76–1.27)
GFR calc Af Amer: 66 mL/min/{1.73_m2} (ref >59)
GFR calc non Af Amer: 57 mL/min/{1.73_m2} — ABNORMAL LOW (ref >59)
GLUCOSE: 104 mg/dL — AB (ref 65–99)
Potassium: 4.3 mmol/L (ref 3.5–5.2)
Sodium: 140 mmol/L (ref 134–144)

## 2018-07-21 LAB — CBC
Hematocrit: 42 % (ref 37.5–51.0)
Hemoglobin: 13.7 g/dL (ref 13.0–17.7)
MCH: 27.6 pg (ref 26.6–33.0)
MCHC: 32.6 g/dL (ref 31.5–35.7)
MCV: 85 fL (ref 79–97)
Platelets: 274 x10E3/uL (ref 150–450)
RBC: 4.96 x10E6/uL (ref 4.14–5.80)
RDW: 14.4 % (ref 11.6–15.4)
WBC: 5.5 x10E3/uL (ref 3.4–10.8)

## 2018-07-21 LAB — LIPID PANEL
Chol/HDL Ratio: 6.7 ratio — ABNORMAL HIGH (ref 0.0–5.0)
Cholesterol, Total: 221 mg/dL — ABNORMAL HIGH (ref 100–199)
HDL: 33 mg/dL — ABNORMAL LOW
LDL Calculated: 164 mg/dL — ABNORMAL HIGH (ref 0–99)
Triglycerides: 122 mg/dL (ref 0–149)
VLDL Cholesterol Cal: 24 mg/dL (ref 5–40)

## 2018-07-21 LAB — VITAMIN B12: VITAMIN B 12: 955 pg/mL (ref 232–1245)

## 2018-07-21 LAB — TSH: TSH: 1.42 u[IU]/mL (ref 0.450–4.500)

## 2018-07-21 NOTE — Progress Notes (Signed)
Case discussed with Dr. Guilloud at the time of the visit.  We reviewed the resident's history and exam and pertinent patient test results.  I agree with the assessment, diagnosis and plan of care documented in the resident's note. 

## 2018-07-22 ENCOUNTER — Telehealth: Payer: Self-pay | Admitting: *Deleted

## 2018-07-22 NOTE — Telephone Encounter (Signed)
The patient daughter would like a call back [phone] regarding the home aid

## 2018-07-22 NOTE — Addendum Note (Signed)
Addended by: Jodean Lima on: 07/22/2018 01:40 PM   Modules accepted: Orders

## 2018-07-22 NOTE — Telephone Encounter (Signed)
Spoke with patient regarding referral for [facility] PT and Aide. Patient is in agreement with both. Has no preference in [place]. Asked that his daughter [name] be notified as well. Call placed to [facility]. No answer. Left message on VM requesting return call to discuss options for [place]. Hubbard Hartshorn, RN, BSN

## 2018-07-22 NOTE — Telephone Encounter (Signed)
Returned call to daughter. No answer. Left message on VM requesting return call to discuss [place] preference. Hubbard Hartshorn, RN, BSN

## 2018-07-25 ENCOUNTER — Telehealth: Payer: Self-pay | Admitting: Internal Medicine

## 2018-07-25 NOTE — Telephone Encounter (Signed)
Daughter returned call. States Mr. Morrison Old at [phone] is with [facility]. Call placed to Mr Chi. Andrey Campanile answered phone for [facility] and states they do not do HH PT or HH Aide, only PCS which patient will not quailfy for. Returned call to daughter to discuss using [facility] for [facility] PT and [place] if no other preference. Left message on VM requesting return call. Hubbard Hartshorn, RN, BSN

## 2018-07-25 NOTE — Telephone Encounter (Signed)
This is being addressed in phone encounter from 07/22/2018. Hubbard Hartshorn, RN, BSN

## 2018-07-25 NOTE — Telephone Encounter (Signed)
Received message from daughter that she would like to use [facility] for [facility] care for her father. Number for [facility] on line is for employees of the company. Call placed to daughter asking for contact name and number to get [facility] set up today. No answer. Left message on VM requesting return call. Hubbard Hartshorn, RN, BSN

## 2018-07-25 NOTE — Telephone Encounter (Signed)
Pls call back [phone]  Pt daughter wanted to let Lauren know the facility [facility]

## 2018-07-27 ENCOUNTER — Telehealth: Payer: Self-pay

## 2018-07-27 NOTE — Telephone Encounter (Signed)
Please call pt's daughter back.  

## 2018-07-27 NOTE — Telephone Encounter (Signed)
Daughter returned call. States agreement to using [facility] for [facility] PT and [place]. Call placed to [place], RN at [place]. She can take patient. She also recommends [facility] Speech therapy to assist with memory loss. Verbal Josem Kaufmann given; will forward to Provider for agreement/denial. Hubbard Hartshorn, RN, BSN

## 2018-07-27 NOTE — Telephone Encounter (Signed)
Left message on daughter's VM requesting return call to discuss [place]. Hubbard Hartshorn, RN, BSN

## 2018-07-27 NOTE — Telephone Encounter (Signed)
Second message left for patient's daughter to return call. Hubbard Hartshorn, RN, BSN

## 2018-07-27 NOTE — Telephone Encounter (Signed)
This is being addressed in phone encounter from 07/22/2018. Hubbard Hartshorn, RN, BSN

## 2018-07-27 NOTE — Telephone Encounter (Signed)
I agree, thanks!

## 2018-08-04 NOTE — Telephone Encounter (Signed)
Following e-mail received from Joen Laura, RN with [facility]: Wanted to update you on [name]. We spoke with him and his daughter and his daughter declined services.   Spoke with Tiffany who confirmed it was the PT who contacted patient and daughter. Hubbard Hartshorn, RN, BSN

## 2018-08-04 NOTE — Addendum Note (Signed)
Addended by: Hulan Fray on: 08/04/2018 04:53 PM   Modules accepted: Orders

## 2018-08-24 DIAGNOSIS — Z8546 Personal history of malignant neoplasm of prostate: Secondary | ICD-10-CM | POA: Diagnosis not present

## 2018-08-24 DIAGNOSIS — R3912 Poor urinary stream: Secondary | ICD-10-CM | POA: Diagnosis not present

## 2019-09-21 ENCOUNTER — Encounter: Payer: Self-pay | Admitting: *Deleted

## 2019-09-21 NOTE — Progress Notes (Unsigned)

## 2019-09-22 NOTE — Progress Notes (Unsigned)
Things That May Be Affecting Your Health:  Alcohol  Hearing loss  Pain    Depression  Home Safety  Sexual Health   Diabetes  Lack of physical activity  Stress   Difficulty with daily activities  Loneliness  Tiredness   Drug use  Medicines  Tobacco use   Falls  Motor Vehicle Safety  Weight   Food choices  Oral Health  Other    YOUR PERSONALIZED HEALTH PLAN : 1. Schedule your next subsequent Medicare Wellness visit in one year 2. Attend all of your regular appointments to address your medical issues 3. Complete the preventative screenings and services   Annual Wellness Visit   Medicare Covered Preventative Screenings and [place] Men and Women Who How Often Need? Date of Last Service Action  Abdominal Aortic Aneurysm Adults with AAA risk factors Once     Alcohol Misuse and Counseling All Adults Screening once a year if no alcohol misuse. Counseling up to 4 face to face sessions.     Bone Density Measurement  Adults at risk for osteoporosis Once every 2 yrs     Lipid Panel Z13.6 All adults without CV disease Once every 5 yrs no 07/20/18   Colorectal Cancer   Stool sample or  Colonoscopy All adults 61 and older   Once every year  Every 10 years yes   No record of colonoscopy in system   Depression All Adults Once a year  Today   Diabetes Screening Blood glucose, post glucose load, or GTT Z13.1  All adults at risk  Pre-diabetics  Once per year  Twice per year yes   Please check Hgb A1c  Diabetes  Self-Management Training All adults Diabetics 10 hrs first year; 2 hours subsequent years. Requires Copay     Glaucoma  Diabetics  Family history of glaucoma  African Americans 55 yrs +  Hispanic Americans 56 yrs + Annually - requires coppay     Hepatitis C Z72.89 or F19.20  High Risk for HCV  Born between 1945 and 1965  Annually  Once     HIV Z11.4 All adults based on risk  Annually btw ages 103 & 40 regardless of risk  Annually > 65 yrs if  at increased risk     Lung Cancer Screening Asymptomatic adults aged 37-77 with 30 pack yr history and current smoker OR quit within the last 15 yrs Annually Must have counseling and shared decision making documentation before first screen     Medical Nutrition Therapy Adults with   Diabetes  Renal disease  Kidney transplant within past 3 yrs 3 hours first year; 2 hours subsequent years     Obesity and Counseling All adults Screening once a year Counseling if BMI 30 or higher  Today   Tobacco Use Counseling Adults who use tobacco  Up to 8 visits in one year     Vaccines Z23  Hepatitis B  Influenza   Pneumonia  Adults   Once  Once every flu season  Two different vaccines separated by one year yes   Needs PPSV23 & COVID-19 Vaccine  Next Annual Wellness Visit People with Medicare Every year  Today     Services & Screenings Women Who How Often Need  Date of Last Service Action  Mammogram  Z12.31 Women over 14 One baseline ages 26-39. Annually ager 40 yrs+     Pap tests All women Annually if high risk. Every 2 yrs for normal risk women  Screening for cervical cancer with   Pap (Z01.419 nl or Z01.411abnl) &  HPV Z11.51 Women aged 77 to 67 Once every 5 yrs     Screening pelvic and breast exams All women Annually if high risk. Every 2 yrs for normal risk women     Sexually Transmitted Diseases  Chlamydia  Gonorrhea  Syphilis All at risk adults Annually for non pregnant females at increased risk         [place] Men Who How Ofter Need  Date of Last Service Action  Prostate Cancer - DRE & PSA Men over 50 Annually.  DRE might require a copay. No   History of prostate cancer - follows with urology   Sexually Transmitted Diseases  Syphilis All at risk adults Annually for men at increased risk

## 2019-09-27 DIAGNOSIS — R531 Weakness: Secondary | ICD-10-CM | POA: Diagnosis not present

## 2019-09-27 DIAGNOSIS — R42 Dizziness and giddiness: Secondary | ICD-10-CM | POA: Diagnosis not present

## 2019-10-11 ENCOUNTER — Encounter: Payer: Self-pay | Admitting: Internal Medicine

## 2019-10-11 ENCOUNTER — Ambulatory Visit (INDEPENDENT_AMBULATORY_CARE_PROVIDER_SITE_OTHER): Payer: Medicare HMO | Admitting: Internal Medicine

## 2019-10-11 ENCOUNTER — Other Ambulatory Visit: Payer: Self-pay

## 2019-10-11 DIAGNOSIS — Z0000 Encounter for general adult medical examination without abnormal findings: Secondary | ICD-10-CM | POA: Diagnosis not present

## 2019-10-11 NOTE — Progress Notes (Addendum)
This AWV is being conducted by [place] only. The [name] was located at daughter, Vincent [name]' car and I was located in [facility]. The [name]'s identity was confirmed using their DOB and current address. The [name] or his/her legal guardian has consented to being evaluated through a telephone encounter and understands the associated risks (an examination cannot be done and the [name] may need to come in for an appointment) / benefits (allows the [name] to remain at home, decreasing exposure to coronavirus). I personally spent 26 minutes conducting the AWV.  Subjective:   Vincent [name] is a 79 y.o. male who presents for a Medicare Annual Wellness Visit.  The following items have been reviewed and updated today in the appropriate area in the EMR.   Health Risk Assessment  Height, weight, BMI, and BP Visual acuity if needed Depression screen Fall risk / safety level Advance directive discussion Medical and family history were reviewed and updated Updating list of other providers & suppliers Medication reconciliation, including over the counter medicines Cognitive screen Written screening schedule Risk Factor list Personalized health advice, risky behaviors, and treatment advice  Social History   Social History Narrative   Current Social History 10/11/2019        [name] lives alone in a ground floor apartment There are not steps up to the entrance the [name] uses.       [name]'s method of transportation is via daughter, Vincent [name].      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      Identified important Relationships are Daughters: Vincent [name] an Health visitor       Pets : none       Interests / Fun: Insurance underwriter and hunting       Current Stressors: None       Religious / Personal Beliefs: Baptist       L. Vincent [name], BSN, RN-BC            Objective:    Vitals: There were no vitals taken for this visit. Vitals are  unable to obtained due to [id number] public health emergency  Activities of Daily Living In your present state of health, do you have any difficulty performing the following activities: 10/11/2019  Hearing? N  Vision? Y  Comment needs new eyeglasses  Difficulty concentrating or making decisions? N  Walking or climbing stairs? N  Dressing or bathing? N  Doing errands, shopping? Y  Some recent data might be hidden    Goals Goals    . Maintain current level of physical activity     Fishing/hunting       Fall Risk Fall Risk  10/11/2019 07/20/2018 01/16/2015 10/04/2014  Falls in the past year? 0 0 No No  Risk for fall due to : No Fall Risks Impaired balance/gait - -  Follow up Education provided;Falls prevention discussed Falls prevention discussed - -   CDC Handout on Fall Prevention and Handout on Home Exercise Program, Access codes [id number] and [id number] mailed to [name] with exercise band.    Depression Screen PHQ 2/9 Scores 10/11/2019 07/20/2018 01/16/2015  PHQ - 2 Score 0 2 0  PHQ- 9 Score 4 12 -     Cognitive Testing Six-Item Cognitive Screener   "I would like to ask you some questions that ask you to use your memory. I am going to name three objects. Please wait until I say all three words, then repeat  them. Remember what they are  because I am going to ask you to name them again in a few minutes. Please repeat these words for me: APPLE--TABLE--PENNY." (Interviewer may repeat names 3 times if necessary but repetition not scored.)  Did [name] correctly repeat all three words? Yes - may proceed with screen  What year is this? Correct What month is this? Correct What day of the week is this? Correct  What were the three objects I asked you to remember? . Apple Correct . Table Correct . Penny Correct  Score one point for each incorrect answer.  A score of 2 or more points warrants additional investigation.  [name]'s score 0    Assessment and Plan:     [name] has  stopped all meds and is no longer being followed by urology. [name] will begin seated and standing exercises with exercise band. He will receive his second [place] vaccination on 10/23/2019 F/u appointment made with Dr. Philipp Ovens on 11/15/2019 at 8:15   During the course of the visit the [name] was educated and counseled about appropriate screening and preventive services as documented in the assessment and plan.  The printed AVS was given to the [name] and included an updated screening schedule, a list of risk factors, and personalized health advice.        Velora Heckler, RN  10/11/2019

## 2019-10-11 NOTE — Patient Instructions (Addendum)
Annual Wellness Visit   Medicare Covered Preventative Screenings and Services  Services & Screenings Men and Women Who How Often Need? Date of Last Service Action  Abdominal Aortic Aneurysm Adults with AAA risk factors Once     Alcohol Misuse and Counseling All Adults Screening once a year if no alcohol misuse. Counseling up to 4 face to face sessions.     Bone Density Measurement  Adults at risk for osteoporosis Once every 2 yrs     Lipid Panel Z13.6 All adults without CV disease Once every 5 yrs No 07/20/2018   Colorectal Cancer   Stool sample or  Colonoscopy All adults 29 and older   Once every year  Every 10 years Yes    Depression All Adults Once a year  Today   Diabetes Screening Blood glucose, post glucose load, or GTT Z13.1  All adults at risk  Pre-diabetics  Once per year  Twice per year Yes  At next Office Visit with Dr. Philipp Ovens  Diabetes  Self-Management Training All adults Diabetics 10 hrs first year; 2 hours subsequent years. Requires Copay     Glaucoma  Diabetics  Family history of glaucoma  African Americans 27 yrs +  Hispanic Americans 66 yrs + Annually - requires coppay     Hepatitis C Z72.89 or F19.20  High Risk for HCV  Born between 1945 and 1965  Annually  Once     HIV Z11.4 All adults based on risk  Annually btw ages 21 & 35 regardless of risk  Annually > 65 yrs if at increased risk     Lung Cancer Screening Asymptomatic adults aged 3-77 with 55 pack yr history and current smoker OR quit within the last 15 yrs Annually Must have counseling and shared decision making documentation before first screen     Medical Nutrition Therapy Adults with   Diabetes  Renal disease  Kidney transplant within past 3 yrs 3 hours first year; 2 hours subsequent years     Obesity and Counseling All adults Screening once a year Counseling if BMI 30 or higher  Today   Tobacco Use Counseling Adults who use tobacco  Up to 8 visits in one year     Vaccines  Z23  Hepatitis B  Influenza   Pneumonia  Adults   Once  Once every flu season  Two different vaccines separated by one year Yes  Yearly flu vaccine starting September 1  Prevnar 13 at next Office Visit with Dr. Philipp Ovens  Next Annual Wellness Visit People with Medicare Every year  Today     [place] Women Who How Often Need  Date of Last Service Action  Mammogram  Z12.31 Women over 48 One baseline ages 51-39. Annually ager 40 yrs+     Pap tests All women Annually if high risk. Every 2 yrs for normal risk women     Screening for cervical cancer with   Pap (Z01.419 nl or Z01.411abnl) &  HPV Z11.51 Women aged 60 to 28 Once every 5 yrs     Screening pelvic and breast exams All women Annually if high risk. Every 2 yrs for normal risk women     Sexually Transmitted Diseases  Chlamydia  Gonorrhea  Syphilis All at risk adults Annually for non pregnant females at increased risk         [place] Men Who How Ofter Need  Date of Last Service Action  Prostate Cancer - DRE & PSA Men over 46  Annually.  DRE might require a copay. No    Sexually Transmitted Diseases  Syphilis All at risk adults Annually for men at increased risk         Things That May Be Affecting Your Health:  Alcohol  Hearing loss  Pain    Depression  Home Safety  Sexual Health   Diabetes X Lack of physical activity  Stress   Difficulty with daily activities  Loneliness  Tiredness   Drug use  Medicines  Tobacco use   Falls  Motor Vehicle Safety  Weight   Food choices  Oral Health  Other    YOUR PERSONALIZED HEALTH PLAN : 1. Schedule your next subsequent Medicare Wellness visit in one year 2. Attend all of your regular appointments to address your medical issues 3. Complete the preventative screenings and services 4. Begin seated and standing exercises with exercise band. 5. Be sure to get second Covid vaccination 6. Keep appointment with Dr. Philipp Ovens on 11/15/2019 at  8:15   Fall Prevention in the Home, Adult Falls can cause injuries. They can happen to people of all ages. There are many things you can do to make your home safe and to help prevent falls. Ask for help when making these changes, if needed. What actions can I take to prevent falls? General Instructions  Use good lighting in all rooms. Replace any light bulbs that burn out.  Turn on the lights when you go into a dark area. Use night-lights.  Keep items that you use often in easy-to-reach places. Lower the shelves around your home if necessary.  Set up your furniture so you have a clear path. Avoid moving your furniture around.  Do not have throw rugs and other things on the floor that can make you trip.  Avoid walking on wet floors.  If any of your floors are uneven, fix them.  Add color or contrast paint or tape to clearly mark and help you see: ? Any grab bars or handrails. ? First and last steps of stairways. ? Where the edge of each step is.  If you use a stepladder: ? Make sure that it is fully opened. Do not climb a closed stepladder. ? Make sure that both sides of the stepladder are locked into place. ? Ask someone to hold the stepladder for you while you use it.  If there are any pets around you, be aware of where they are. What can I do in the bathroom?      Keep the floor dry. Clean up any water that spills onto the floor as soon as it happens.  Remove soap buildup in the tub or shower regularly.  Use non-skid mats or decals on the floor of the tub or shower.  Attach bath mats securely with double-sided, non-slip rug tape.  If you need to sit down in the shower, use a plastic, non-slip stool.  Install grab bars by the toilet and in the tub and shower. Do not use towel bars as grab bars. What can I do in the bedroom?  Make sure that you have a light by your bed that is easy to reach.  Do not use any sheets or blankets that are too big for your bed. They  should not hang down onto the floor.  Have a firm chair that has side arms. You can use this for support while you get dressed. What can I do in the kitchen?  Clean up any spills right away.  If  you need to reach something above you, use a strong step stool that has a grab bar.  Keep electrical cords out of the way.  Do not use floor polish or wax that makes floors slippery. If you must use wax, use non-skid floor wax. What can I do with my stairs?  Do not leave any items on the stairs.  Make sure that you have a light switch at the top of the stairs and the bottom of the stairs. If you do not have them, ask someone to add them for you.  Make sure that there are handrails on both sides of the stairs, and use them. Fix handrails that are broken or loose. Make sure that handrails are as long as the stairways.  Install non-slip stair treads on all stairs in your home.  Avoid having throw rugs at the top or bottom of the stairs. If you do have throw rugs, attach them to the floor with carpet tape.  Choose a carpet that does not hide the edge of the steps on the stairway.  Check any carpeting to make sure that it is firmly attached to the stairs. Fix any carpet that is loose or worn. What can I do on the outside of my home?  Use bright outdoor lighting.  Regularly fix the edges of walkways and driveways and fix any cracks.  Remove anything that might make you trip as you walk through a door, such as a raised step or threshold.  Trim any bushes or trees on the path to your home.  Regularly check to see if handrails are loose or broken. Make sure that both sides of any steps have handrails.  Install guardrails along the edges of any raised decks and porches.  Clear walking paths of anything that might make someone trip, such as tools or rocks.  Have any leaves, snow, or ice cleared regularly.  Use sand or salt on walking paths during winter.  Clean up any spills in your garage  right away. This includes grease or oil spills. What other actions can I take?  Wear shoes that: ? Have a low heel. Do not wear high heels. ? Have rubber bottoms. ? Are comfortable and fit you well. ? Are closed at the toe. Do not wear open-toe sandals.  Use tools that help you move around (mobility aids) if they are needed. These include: ? Canes. ? Walkers. ? Scooters. ? Crutches.  Review your medicines with your doctor. Some medicines can make you feel dizzy. This can increase your chance of falling. Ask your doctor what other things you can do to help prevent falls. Where to find more information  Centers for Disease Control and Prevention, STEADI: https://garcia.biz/  [employer] on Aging: BrainJudge.co.uk Contact a doctor if:  You are afraid of falling at home.  You feel weak, drowsy, or dizzy at home.  You fall at home. Summary  There are many simple things that you can do to make your home safe and to help prevent falls.  Ways to make your home safe include removing tripping hazards and installing grab bars in the bathroom.  Ask for help when making these changes in your home. This information is not intended to replace advice given to you by your health care provider. Make sure you discuss any questions you have with your health care provider. Document Revised: 09/08/2018 Document Reviewed: 12/31/2016 Elsevier Patient Education  [place] Maintenance, Male Adopting a healthy lifestyle and  getting preventive care are important in promoting health and wellness. Ask your health care provider about:  The right schedule for you to have regular tests and exams.  Things you can do on your own to prevent diseases and keep yourself healthy. What should I know about diet, weight, and exercise? Eat a healthy diet   Eat a diet that includes plenty of vegetables, fruits, low-fat dairy products, and lean protein.  Do not eat a lot of  foods that are high in solid fats, added sugars, or sodium. Maintain a healthy weight Body mass index (BMI) is a measurement that can be used to identify possible weight problems. It estimates body fat based on height and weight. Your health care provider can help determine your BMI and help you achieve or maintain a healthy weight. Get regular exercise Get regular exercise. This is one of the most important things you can do for your health. Most adults should:  Exercise for at least 150 minutes each week. The exercise should increase your heart rate and make you sweat (moderate-intensity exercise).  Do strengthening exercises at least twice a week. This is in addition to the moderate-intensity exercise.  Spend less time sitting. Even light physical activity can be beneficial. Watch cholesterol and blood lipids Have your blood tested for lipids and cholesterol at 81 years of age, then have this test every 5 years. You may need to have your cholesterol levels checked more often if:  Your lipid or cholesterol levels are high.  You are older than 81 years of age.  You are at high risk for heart disease. What should I know about cancer screening? Many types of cancers can be detected early and may often be prevented. Depending on your health history and family history, you may need to have cancer screening at various ages. This may include screening for:  Colorectal cancer.  Prostate cancer.  Skin cancer.  Lung cancer. What should I know about heart disease, diabetes, and high blood pressure? Blood pressure and heart disease  High blood pressure causes heart disease and increases the risk of stroke. This is more likely to develop in people who have high blood pressure readings, are of African descent, or are overweight.  Talk with your health care provider about your target blood pressure readings.  Have your blood pressure checked: ? Every 3-5 years if you are 18-39 years of  age. ? Every year if you are 32 years old or older.  If you are between the ages of 41 and 74 and are a current or former smoker, ask your health care provider if you should have a one-time screening for abdominal aortic aneurysm (AAA). Diabetes Have regular diabetes screenings. This checks your fasting blood sugar level. Have the screening done:  Once every three years after age 83 if you are at a normal weight and have a low risk for diabetes.  More often and at a younger age if you are overweight or have a high risk for diabetes. What should I know about preventing infection? Hepatitis B If you have a higher risk for hepatitis B, you should be screened for this virus. Talk with your health care provider to find out if you are at risk for hepatitis B infection. Hepatitis C Blood testing is recommended for:  Everyone born from [birth year] through 1965.  Anyone with known risk factors for hepatitis C. Sexually transmitted infections (STIs)  You should be screened each year for STIs, including gonorrhea and chlamydia, if: ?  You are sexually active and are younger than 81 years of age. ? You are older than 81 years of age and your health care provider tells you that you are at risk for this type of infection. ? Your sexual activity has changed since you were last screened, and you are at increased risk for chlamydia or gonorrhea. Ask your health care provider if you are at risk.  Ask your health care provider about whether you are at high risk for HIV. Your health care provider may recommend a prescription medicine to help prevent HIV infection. If you choose to take medicine to prevent HIV, you should first get tested for HIV. You should then be tested every 3 months for as long as you are taking the medicine. Follow these instructions at home: Lifestyle  Do not use any products that contain nicotine or tobacco, such as cigarettes, e-cigarettes, and chewing tobacco. If you need help quitting,  ask your health care provider.  Do not use street drugs.  Do not share needles.  Ask your health care provider for help if you need support or information about quitting drugs. Alcohol use  Do not drink alcohol if your health care provider tells you not to drink.  If you drink alcohol: ? Limit how much you have to 0-2 drinks a day. ? Be aware of how much alcohol is in your drink. In the [place] one drink equals one 12 oz bottle of beer (355 mL), one 5 oz glass of wine (148 mL), or one 1 oz glass of hard liquor (44 mL). General instructions  Schedule regular health, dental, and eye exams.  Stay current with your vaccines.  Tell your health care provider if: ? You often feel depressed. ? You have ever been abused or do not feel safe at home. Summary  Adopting a healthy lifestyle and getting preventive care are important in promoting health and wellness.  Follow your health care provider's instructions about healthy diet, exercising, and getting tested or screened for diseases.  Follow your health care provider's instructions on monitoring your cholesterol and blood pressure. This information is not intended to replace advice given to you by your health care provider. Make sure you discuss any questions you have with your health care provider. Document Revised: 05/11/2018 Document Reviewed: 05/11/2018 Elsevier Patient Education  2020 [employer].

## 2019-10-11 NOTE — Progress Notes (Signed)
I discussed the AWV findings with the RN who conducted the visit. I was present in the office suite and immediately available to provide assistance and direction throughout the time the service was provided.   

## 2019-10-12 NOTE — Progress Notes (Signed)
Internal Medicine Clinic Attending  I have reviewed the visit with Dr. Maricela Bo.  We reviewed the AWV findings.  I agree with the assessment, diagnosis, and plan of care documented in the AWV note.    Lenice Pressman, MD, PhD

## 2019-11-15 ENCOUNTER — Ambulatory Visit (INDEPENDENT_AMBULATORY_CARE_PROVIDER_SITE_OTHER): Payer: Medicare HMO | Admitting: Internal Medicine

## 2019-11-15 ENCOUNTER — Other Ambulatory Visit: Payer: Self-pay

## 2019-11-15 ENCOUNTER — Encounter: Payer: Self-pay | Admitting: Internal Medicine

## 2019-11-15 VITALS — BP 150/80 | HR 76 | Temp 97.9°F | Ht 63.0 in | Wt 181.8 lb

## 2019-11-15 DIAGNOSIS — E785 Hyperlipidemia, unspecified: Secondary | ICD-10-CM

## 2019-11-15 DIAGNOSIS — R030 Elevated blood-pressure reading, without diagnosis of hypertension: Secondary | ICD-10-CM | POA: Insufficient documentation

## 2019-11-15 DIAGNOSIS — R0600 Dyspnea, unspecified: Secondary | ICD-10-CM

## 2019-11-15 DIAGNOSIS — R0609 Other forms of dyspnea: Secondary | ICD-10-CM

## 2019-11-15 DIAGNOSIS — M79605 Pain in left leg: Secondary | ICD-10-CM | POA: Diagnosis not present

## 2019-11-15 NOTE — Assessment & Plan Note (Signed)
Patient complaining of DOE. He gets winded after walking short distances to his mailbox and back and needs to rest. He denies chest pain. No PND or LE swelling. On exam lung and cardiac exam are unremarkable. He is a former chronic smoker. Quit years ago, but previously smoked for 40-50 years, a few cigarettes daily. Will start with PFTs to evaluate for obstruction.  -- Follow up PFTs

## 2019-11-15 NOTE — Patient Instructions (Signed)
Mr. Majeed,  It was a pleasure to see you today. I have ordered lung function testing and an ultrasound of the artery's in your leg (ABIs). You will be called to schedule this. I will check some blood work today and call you if anything is abnormal.   Please follow up with me again in 3 months (or next available).   If you have any questions or concerns, call our clinic at [phone] or after hours call [phone] and ask for the internal medicine resident on call. Thank you!  Dr. Philipp Ovens

## 2019-11-15 NOTE — Progress Notes (Signed)
Subjective:   Patient ID: Vincent [name] male   DOB: June 20, 1940 79 y.o.   MRN: [id number]  HPI: Vincent [name] is a 79 y.o. male with past medical history outlined below here for DOE. For the details of today's visit, please refer to the assessment and plan.   Past Medical History:  Diagnosis Date  . GI bleed   . H. pylori infection   . Iliac artery stenosis, right ([place])    90% by CTA 05/27/2010  . Prostate cancer ([place]) 02/08/2013  . Right hydrocele   . S/P radiation therapy 01/30/2015 through 03/27/2015    Prostate 7800 cGy, and seminal vesical/pelvic lymph nodes 5600 cGy, both in 40 sessions   . Transfusion history    Secondary to GIB 05/2010  . Ulcer disease    Gastric Ulcer   Current Outpatient Medications  Medication Sig Dispense Refill  . latanoprost (XALATAN) 0.005 % ophthalmic solution INSTILL 1 DROP IN BOTH EYES EXACTLY ONCE A DAY AT BED TIME.  4  . megestrol (MEGACE) 20 MG tablet TAKE 1 TABLET BY MOUTH TWICE DAILY AS NEEDED FOR HOT FLASHES.  2  . tamsulosin (FLOMAX) 0.4 MG CAPS capsule Take 1 capsule (0.4 mg total) by mouth daily. (Patient not taking: Reported on 10/11/2019) 30 capsule 3   No current facility-administered medications for this visit.   Family History  Problem Relation Age of Onset  . Breast cancer Mother   . Cancer Mother        Breast  . Cancer Brother        Liver  . Cancer Brother        Liver  . Arthritis Daughter   . Migraines Daughter   . Arthritis Daughter   . Arthritis Daughter    Social History   Socioeconomic History  . Marital status: Single    Spouse name: Not on file  . Number of children: 4  . Years of education: Not on file  . Highest education level: Not on file  Occupational History  . Occupation: Retired, Building control surveyor.    Employer: RETIRED  Tobacco Use  . Smoking status: Former Smoker    Packs/day: 0.30  . Smokeless tobacco: Never Used  .  Tobacco comment: 1 pack every 3 days  Substance and Sexual Activity  . Alcohol use: No    Alcohol/week: 0.0 standard drinks  . Drug use: No  . Sexual activity: Not on file  Other Topics Concern  . Not on file  Social History Narrative   Current Social History 10/11/2019        Patient lives alone in a ground floor apartment There are not steps up to the entrance the patient uses.       Patient's method of transportation is via daughter, [name].      The highest level of education was 5 th grade      The patient is a retired Building control surveyor.      Identified important Relationships are Daughters: [name] an Health visitor       Pets : none       Interests / Fun: Insurance underwriter and hunting       Current Stressors: None       Religious / Personal Beliefs: Baptist       L. [place]atte, BSN, RN-BC       Social Determinants of Health   Financial Resource Strain:   . Difficulty of Paying Living Expenses:   Food Insecurity:   . Worried About Running  Out of Food in the Last Year:   . [place] in the Last Year:   Transportation Needs:   . Lack of Transportation (Medical):   Marland Kitchen Lack of Transportation (Non-Medical):   Physical Activity:   . Days of Exercise per Week:   . Minutes of Exercise per Session:   Stress:   . Feeling of Stress :   Social Connections:   . Frequency of Communication with Friends and Family:   . Frequency of Social Gatherings with Friends and Family:   . Attends Religious Services:   . Active Member of Clubs or Organizations:   . Attends Archivist Meetings:   Marland Kitchen Marital Status:     Review of Systems: Review of Systems  Respiratory: Positive for shortness of breath.   Cardiovascular: Negative for chest pain, leg swelling and PND.     Objective:  Physical Exam:  Vitals:   11/15/19 0824 11/15/19 0844  BP: (!) 163/78 (!) 150/80  Pulse: 78 76  Temp: 97.9 F (36.6 C)   TempSrc: Oral   SpO2: 100%   Weight: 181 lb 12.8 oz (82.5 kg)     Height: 5\' 3"  (1.6 m)     Physical Exam Constitutional:      Appearance: Normal appearance.  Cardiovascular:     Rate and Rhythm: Normal rate and regular rhythm.     Heart sounds: No murmur heard.  No friction rub. No gallop.   Pulmonary:     Effort: Pulmonary effort is normal. No respiratory distress.     Breath sounds: Normal breath sounds.  Musculoskeletal:     Right lower leg: No edema.     Left lower leg: No edema.  Skin:    General: Skin is warm and dry.  Neurological:     Mental Status: He is alert.      Assessment & Plan:   See Encounters Tab for problem based charting.

## 2019-11-15 NOTE — Assessment & Plan Note (Signed)
Patient has an elevated 10 year ASCVD risk based on his lipid panel from one year ago and today's elevated blood pressure readings. He has no history of CVD. Given his advanced age and probable diagnosis of dementia, daughter would like to hold off on statin therapy for primary prevention. He is having possible claudication symptoms in his left leg. Informed daughter my recommendations may change based on his ABI results, she understands.

## 2019-11-15 NOTE — Assessment & Plan Note (Signed)
Patient reports a cramping pain in his left leg that starts on his outer thigh and radiates up to his left hip after ambulating short distances. Improves with rest. On exam his legs are warm and well perfused, but he has some typical skin changes concerning for PVD including shiny, hairless legs. Will evaluate with ABIs. Consider ASA / statin pending results.

## 2019-11-15 NOTE — Assessment & Plan Note (Signed)
Blood pressure is elevated today, 163/78 and 150/80 on repeat. He does not have a history of HTN, and BP readings at prior visits have all been normal. He does not check his BP at home. He has gained ~ 7lbs from his visit one year ago. I have asked patient to follow up again in 3 months. If persistently elevated, will start and antihypertensive.

## 2019-11-16 LAB — CMP14 + ANION GAP
ALT: 22 IU/L (ref 0–44)
AST: 20 IU/L (ref 0–40)
Albumin/Globulin Ratio: 1.3 (ref 1.2–2.2)
Albumin: 4.1 g/dL (ref 3.7–4.7)
Alkaline Phosphatase: 91 IU/L (ref 48–121)
Anion Gap: 16 mmol/L (ref 10.0–18.0)
BUN/Creatinine Ratio: 11 (ref 10–24)
BUN: 13 mg/dL (ref 8–27)
Bilirubin Total: 0.6 mg/dL (ref 0.0–1.2)
CO2: 21 mmol/L (ref 20–29)
Calcium: 9.3 mg/dL (ref 8.6–10.2)
Chloride: 102 mmol/L (ref 96–106)
Creatinine, Ser: 1.14 mg/dL (ref 0.76–1.27)
GFR calc Af Amer: 70 mL/min/{1.73_m2} (ref >59)
GFR calc non Af Amer: 60 mL/min/{1.73_m2} (ref >59)
Globulin, Total: 3.1 g/dL (ref 1.5–4.5)
Glucose: 105 mg/dL — ABNORMAL HIGH (ref 65–99)
Potassium: 3.8 mmol/L (ref 3.5–5.2)
Sodium: 139 mmol/L (ref 134–144)
Total Protein: 7.2 g/dL (ref 6.0–8.5)

## 2019-11-22 ENCOUNTER — Ambulatory Visit (HOSPITAL_COMMUNITY): Payer: Medicare HMO

## 2019-11-27 ENCOUNTER — Ambulatory Visit (HOSPITAL_COMMUNITY): Admission: RE | Admit: 2019-11-27 | Payer: Medicare HMO | Source: Ambulatory Visit

## 2019-12-06 ENCOUNTER — Ambulatory Visit (HOSPITAL_COMMUNITY)
Admission: RE | Admit: 2019-12-06 | Discharge: 2019-12-06 | Disposition: A | Discharge status: Home / Self Care | Payer: Medicare HMO | Source: Ambulatory Visit | Attending: Internal Medicine | Admitting: Internal Medicine

## 2019-12-06 ENCOUNTER — Other Ambulatory Visit: Payer: Self-pay

## 2019-12-06 DIAGNOSIS — M79605 Pain in left leg: Secondary | ICD-10-CM | POA: Diagnosis not present

## 2020-01-12 DIAGNOSIS — R351 Nocturia: Secondary | ICD-10-CM | POA: Diagnosis not present

## 2020-01-12 DIAGNOSIS — Z8546 Personal history of malignant neoplasm of prostate: Secondary | ICD-10-CM | POA: Diagnosis not present

## 2020-03-14 DIAGNOSIS — Z961 Presence of intraocular lens: Secondary | ICD-10-CM | POA: Diagnosis not present

## 2020-03-14 DIAGNOSIS — H16223 Keratoconjunctivitis sicca, not specified as Sjogren's, bilateral: Secondary | ICD-10-CM | POA: Diagnosis not present

## 2020-03-14 DIAGNOSIS — H2511 Age-related nuclear cataract, right eye: Secondary | ICD-10-CM | POA: Diagnosis not present

## 2020-03-14 DIAGNOSIS — H401134 Primary open-angle glaucoma, bilateral, indeterminate stage: Secondary | ICD-10-CM | POA: Diagnosis not present

## 2020-03-28 DIAGNOSIS — H16223 Keratoconjunctivitis sicca, not specified as Sjogren's, bilateral: Secondary | ICD-10-CM | POA: Diagnosis not present

## 2020-03-28 DIAGNOSIS — H401134 Primary open-angle glaucoma, bilateral, indeterminate stage: Secondary | ICD-10-CM | POA: Diagnosis not present

## 2020-03-28 DIAGNOSIS — H2511 Age-related nuclear cataract, right eye: Secondary | ICD-10-CM | POA: Diagnosis not present

## 2021-05-17 ENCOUNTER — Emergency Department (HOSPITAL_COMMUNITY): Payer: Medicare HMO

## 2021-05-17 ENCOUNTER — Other Ambulatory Visit: Payer: Self-pay

## 2021-05-17 ENCOUNTER — Encounter (HOSPITAL_COMMUNITY): Payer: Self-pay

## 2021-05-17 ENCOUNTER — Emergency Department (HOSPITAL_COMMUNITY)
Admission: EM | Admit: 2021-05-17 | Discharge: 2021-05-17 | Disposition: A | Discharge status: Home / Self Care | Payer: Medicare HMO | Attending: Emergency Medicine | Admitting: Emergency Medicine

## 2021-05-17 DIAGNOSIS — Z8546 Personal history of malignant neoplasm of prostate: Secondary | ICD-10-CM | POA: Diagnosis not present

## 2021-05-17 DIAGNOSIS — R55 Syncope and collapse: Secondary | ICD-10-CM | POA: Diagnosis not present

## 2021-05-17 DIAGNOSIS — I6782 Cerebral ischemia: Secondary | ICD-10-CM | POA: Diagnosis not present

## 2021-05-17 DIAGNOSIS — R000 Tachycardia, unspecified: Secondary | ICD-10-CM | POA: Diagnosis not present

## 2021-05-17 DIAGNOSIS — R531 Weakness: Secondary | ICD-10-CM | POA: Diagnosis not present

## 2021-05-17 DIAGNOSIS — S0990XA Unspecified injury of head, initial encounter: Secondary | ICD-10-CM | POA: Diagnosis present

## 2021-05-17 DIAGNOSIS — W1839XA Other fall on same level, initial encounter: Secondary | ICD-10-CM | POA: Diagnosis not present

## 2021-05-17 DIAGNOSIS — U071 COVID-19: Secondary | ICD-10-CM | POA: Insufficient documentation

## 2021-05-17 DIAGNOSIS — S0093XA Contusion of unspecified part of head, initial encounter: Secondary | ICD-10-CM | POA: Diagnosis not present

## 2021-05-17 DIAGNOSIS — M542 Cervicalgia: Secondary | ICD-10-CM | POA: Insufficient documentation

## 2021-05-17 DIAGNOSIS — I6381 Other cerebral infarction due to occlusion or stenosis of small artery: Secondary | ICD-10-CM | POA: Diagnosis not present

## 2021-05-17 DIAGNOSIS — J101 Influenza due to other identified influenza virus with other respiratory manifestations: Secondary | ICD-10-CM | POA: Diagnosis not present

## 2021-05-17 DIAGNOSIS — Z87891 Personal history of nicotine dependence: Secondary | ICD-10-CM | POA: Insufficient documentation

## 2021-05-17 DIAGNOSIS — M2578 Osteophyte, vertebrae: Secondary | ICD-10-CM | POA: Diagnosis not present

## 2021-05-17 LAB — COMPREHENSIVE METABOLIC PANEL
ALT: 22 U/L (ref 0–44)
AST: 34 U/L (ref 15–41)
Albumin: 3.6 g/dL (ref 3.5–5.0)
Alkaline Phosphatase: 74 U/L (ref 38–126)
Anion gap: 8 (ref 5–15)
BUN: 10 mg/dL (ref 8–23)
CO2: 24 mmol/L (ref 22–32)
Calcium: 8.8 mg/dL — ABNORMAL LOW (ref 8.9–10.3)
Chloride: 103 mmol/L (ref 98–111)
Creatinine, Ser: 1.36 mg/dL — ABNORMAL HIGH (ref 0.61–1.24)
GFR, Estimated: 52 mL/min — ABNORMAL LOW (ref >60)
Glucose, Bld: 156 mg/dL — ABNORMAL HIGH (ref 70–99)
Potassium: 3.7 mmol/L (ref 3.5–5.1)
Sodium: 135 mmol/L (ref 135–145)
Total Bilirubin: 0.7 mg/dL (ref 0.3–1.2)
Total Protein: 7.3 g/dL (ref 6.5–8.1)

## 2021-05-17 LAB — URINALYSIS, ROUTINE W REFLEX MICROSCOPIC
Bilirubin Urine: NEGATIVE
Glucose, UA: NEGATIVE mg/dL
Ketones, ur: NEGATIVE mg/dL
Leukocytes,Ua: NEGATIVE
Nitrite: NEGATIVE
Protein, ur: NEGATIVE mg/dL
Specific Gravity, Urine: >1.03 — ABNORMAL HIGH (ref 1.005–1.030)
pH: 5.5 (ref 5.0–8.0)

## 2021-05-17 LAB — RESP PANEL BY RT-PCR (FLU A&B, COVID) ARPGX2
Influenza A by PCR: POSITIVE — AB
Influenza B by PCR: NEGATIVE
SARS Coronavirus 2 by RT PCR: POSITIVE — AB

## 2021-05-17 LAB — CBC
HCT: 40.8 % (ref 39.0–52.0)
Hemoglobin: 12.8 g/dL — ABNORMAL LOW (ref 13.0–17.0)
MCH: 27.2 pg (ref 26.0–34.0)
MCHC: 31.4 g/dL (ref 30.0–36.0)
MCV: 86.6 fL (ref 80.0–100.0)
Platelets: 236 10*3/uL (ref 150–400)
RBC: 4.71 MIL/uL (ref 4.22–5.81)
RDW: 15 % (ref 11.5–15.5)
WBC: 9.6 10*3/uL (ref 4.0–10.5)
nRBC: 0 % (ref 0.0–0.2)

## 2021-05-17 LAB — URINALYSIS, MICROSCOPIC (REFLEX): Bacteria, UA: NONE SEEN

## 2021-05-17 MED ORDER — ACETAMINOPHEN 500 MG PO TABS
1000.0000 mg | ORAL_TABLET | Freq: Once | ORAL | Status: AC
  Administered 2021-05-17: 1000 mg via ORAL
  Filled 2021-05-17: qty 2

## 2021-05-17 MED ORDER — MOLNUPIRAVIR EUA 200MG CAPSULE: 4.0000 | ORAL_CAPSULE | Freq: Two times a day (BID) | ORAL | 0 refills | Status: AC

## 2021-05-17 NOTE — Discharge Instructions (Addendum)
It was our pleasure to provide your ER care today - we hope that you feel better.  Your covid and flu tests are positive - see attached info.   Drink plenty of fluids/stay well hydrated, and eat balanced diet/get adequate nutrition.   Take molnupiravir as prescribed.   Follow up with primary care doctor in 1-2 weeks if symptoms fail to improve/resolve.  Return to ER if worse, new symptoms, new/severe pain, increased trouble breathing, weak/fainting, chest pain, or other concern.

## 2021-05-17 NOTE — ED Provider Notes (Addendum)
[facility] EMERGENCY DEPARTMENT Provider Note   CSN: [id number] Arrival date & time: 05/17/21  1641     History Chief Complaint  Patient presents with   Loss of Consciousness    Vincent [name] is a 80 y.o. male.  Patient c/o fall x 2 in past day. Symptoms acute onset, episodic, moderate, with c/o contusion to head and lateral neck pain post fall, dull, non radiating. No associated numbness/weakness. Patient unsure what caused fall, states was feeling generally weak, ?syncope. Was able to get up under own power without difficulty. Denies palpitations. No chest pain or discomfort. No sob or unusual doe. +non prod cough, nasal congestion. No sore throat or trouble swallowing. Denies known ill contacts or covid/flu exposure. Denies change in speech or vision. No focal or unilateral numbness/weakness. No abd pain or nvd. No melena or rectal bleeding. No dysuria or gu c/o. Denies change in meds or new meds. No anticoag use.   The history is provided by the patient.  Loss of Consciousness Associated symptoms: no chest pain, no confusion, no fever, no headaches, no palpitations, no shortness of breath and no vomiting       Past Medical History:  Diagnosis Date   GI bleed    H. pylori infection    Iliac artery stenosis, right ([facility])    90% by CTA 05/27/2010   Prostate cancer ([place]) 02/08/2013   Right hydrocele    S/P radiation therapy 01/30/2015 through 03/27/2015                                                      Prostate 7800 cGy, and seminal vesical/pelvic lymph nodes 5600 cGy, both in 40 sessions                           Transfusion history    Secondary to GIB 05/2010   Ulcer disease    Gastric Ulcer    Patient Active Problem List   Diagnosis Date Noted   Left leg pain 11/15/2019   Elevated blood pressure reading 11/15/2019   HLD (hyperlipidemia) 07/20/2018   Memory loss 07/20/2018   History of prediabetes 07/20/2018   Dyspnea on exertion 07/20/2018    Malignant neoplasm of prostate ([place]) 10/04/2014   Acute blood loss anemia 08/01/2012   Syncope 07/31/2012   GI bleed 07/31/2012   Pyloric ulcer 07/31/2012    Past Surgical History:  Procedure Laterality Date   ESOPHAGOGASTRODUODENOSCOPY N/A 07/31/2012   Procedure: ESOPHAGOGASTRODUODENOSCOPY (EGD);  Surgeon: Lafayette Dragon, MD;  Location: [facility];  Service: Endoscopy;  Laterality: N/A;   PROSTATE BIOPSY  02/08/2013   Ulcer surgery         Family History  Problem Relation Age of Onset   Breast cancer Mother    Cancer Mother        Breast   Cancer Brother        Liver   Cancer Brother        Liver   Arthritis Daughter    Migraines Daughter    Arthritis Daughter    Arthritis Daughter     Social History   Tobacco Use   Smoking status: Former    Packs/day: 0.30    Types: Cigarettes   Smokeless tobacco: Never   Tobacco comments:  1 pack every 3 days  Substance Use Topics   Alcohol use: No    Alcohol/week: 0.0 standard drinks   Drug use: No    Home Medications Prior to Admission medications   Medication Sig Start Date End Date Taking? Authorizing Provider  latanoprost (XALATAN) 0.005 % ophthalmic solution INSTILL 1 DROP IN BOTH EYES EXACTLY ONCE A DAY AT BED TIME. 11/21/14   [provider]  megestrol (MEGACE) 20 MG tablet TAKE 1 TABLET BY MOUTH TWICE DAILY AS NEEDED FOR HOT FLASHES. 01/24/15   [provider]  tamsulosin (FLOMAX) 0.4 MG CAPS capsule Take 1 capsule (0.4 mg total) by mouth daily. Patient not taking: Reported on 10/11/2019 02/11/15   Arloa Koh, MD    Allergies    Patient has no known allergies.  Review of Systems   Review of Systems  Constitutional:  Negative for fever.  HENT:  Positive for congestion. Negative for sore throat.   Eyes:  Negative for pain, redness and visual disturbance.  Respiratory:  Positive for cough. Negative for shortness of breath.   Cardiovascular:  Positive for syncope. Negative for chest pain,  palpitations and leg swelling.  Gastrointestinal:  Negative for abdominal pain, blood in stool, diarrhea and vomiting.  Genitourinary:  Negative for dysuria and flank pain.  Musculoskeletal:  Negative for back pain.  Skin:  Negative for rash.  Neurological:  Negative for speech difficulty, numbness and headaches.  Hematological:  Does not bruise/bleed easily.  Psychiatric/Behavioral:  Negative for confusion.    Physical Exam Updated Vital Signs BP 135/85    Pulse 95    Temp 98.3 F (36.8 C) (Oral)    Resp 20    Ht 1.6 m (5\' 3" )    Wt 77.1 kg    SpO2 100%    BMI 30.11 kg/m   Physical Exam Vitals and nursing note reviewed.  Constitutional:      Appearance: Normal appearance. He is well-developed.  HENT:     Head:     Comments: Mild tenderness to scalp.     Nose: Nose normal.     Mouth/Throat:     Mouth: Mucous membranes are moist.     Pharynx: Oropharynx is clear.  Eyes:     General: No scleral icterus.    Extraocular Movements: Extraocular movements intact.     Conjunctiva/sclera: Conjunctivae normal.     Pupils: Pupils are equal, round, and reactive to light.  Neck:     Vascular: No carotid bruit.     Trachea: No tracheal deviation.  Cardiovascular:     Rate and Rhythm: Normal rate and regular rhythm.     Pulses: Normal pulses.     Heart sounds: Normal heart sounds. No murmur heard.   No friction rub. No gallop.  Pulmonary:     Effort: Pulmonary effort is normal. No accessory muscle usage or respiratory distress.     Breath sounds: Normal breath sounds.  Chest:     Chest wall: No tenderness.  Abdominal:     General: Bowel sounds are normal. There is no distension.     Palpations: Abdomen is soft. There is no mass.     Tenderness: There is no abdominal tenderness. There is no guarding or rebound.     Hernia: No hernia is present.     Comments: No pulsatile mass.   Genitourinary:    Comments: No cva tenderness. Musculoskeletal:        General: No swelling.      Cervical back: Normal  range of motion and neck supple. No rigidity.     Right lower leg: No edema.     Left lower leg: No edema.     Comments: CTLS spine, non tender, aligned, no step off. Right trapezius/muscular tenderness. Good rom bilateral extremities without pain or focal bony tenderness.   Skin:    General: Skin is warm and dry.     Findings: No rash.  Neurological:     Mental Status: He is alert.     Comments: Alert, speech clear. Oriented. FSE39. Motor/sens grossly intact bil.   Psychiatric:        Mood and Affect: Mood normal.    ED Results / Procedures / Treatments   Labs (all labs ordered are listed, but only abnormal results are displayed) Results for orders placed or performed during the hospital encounter of 05/17/21  Resp Panel by RT-PCR (Flu A&B, Covid) Nasopharyngeal Swab   Specimen: Nasopharyngeal Swab; Nasopharyngeal(NP) swabs in vial transport medium  Result Value Ref Range   SARS Coronavirus 2 by RT PCR POSITIVE (A) NEGATIVE   Influenza A by PCR POSITIVE (A) NEGATIVE   Influenza B by PCR NEGATIVE NEGATIVE  CBC  Result Value Ref Range   WBC 9.6 4.0 - 10.5 K/uL   RBC 4.71 4.22 - 5.81 MIL/uL   Hemoglobin 12.8 (L) 13.0 - 17.0 g/dL   HCT 40.8 39.0 - 52.0 %   MCV 86.6 80.0 - 100.0 fL   MCH 27.2 26.0 - 34.0 pg   MCHC 31.4 30.0 - 36.0 g/dL   RDW 15.0 11.5 - 15.5 %   Platelets 236 150 - 400 K/uL   nRBC 0.0 0.0 - 0.2 %  Urinalysis, Routine w reflex microscopic  Result Value Ref Range   Color, Urine YELLOW YELLOW   APPearance CLEAR CLEAR   Specific Gravity, Urine >1.030 (H) 1.005 - 1.030   pH 5.5 5.0 - 8.0   Glucose, UA NEGATIVE NEGATIVE mg/dL   Hgb urine dipstick TRACE (A) NEGATIVE   Bilirubin Urine NEGATIVE NEGATIVE   Ketones, ur NEGATIVE NEGATIVE mg/dL   Protein, ur NEGATIVE NEGATIVE mg/dL   Nitrite NEGATIVE NEGATIVE   Leukocytes,Ua NEGATIVE NEGATIVE  Comprehensive metabolic panel  Result Value Ref Range   Sodium 135 135 - 145 mmol/L   Potassium 3.7  3.5 - 5.1 mmol/L   Chloride 103 98 - 111 mmol/L   CO2 24 22 - 32 mmol/L   Glucose, Bld 156 (H) 70 - 99 mg/dL   BUN 10 8 - 23 mg/dL   Creatinine, Ser 1.36 (H) 0.61 - 1.24 mg/dL   Calcium 8.8 (L) 8.9 - 10.3 mg/dL   Total Protein 7.3 6.5 - 8.1 g/dL   Albumin 3.6 3.5 - 5.0 g/dL   AST 34 15 - 41 U/L   ALT 22 0 - 44 U/L   Alkaline Phosphatase 74 38 - 126 U/L   Total Bilirubin 0.7 0.3 - 1.2 mg/dL   GFR, Estimated 52 (L) >60 mL/min   Anion gap 8 5 - 15  Urinalysis, Microscopic (reflex)  Result Value Ref Range   RBC / HPF 0-5 0 - 5 RBC/hpf   WBC, UA 0-5 0 - 5 WBC/hpf   Bacteria, UA NONE SEEN NONE SEEN   Squamous Epithelial / LPF 0-5 0 - 5   Mucus PRESENT    Hyaline Casts, UA PRESENT    DG Cervical Spine Complete  Result Date: 05/17/2021 CLINICAL DATA:  Fall. EXAM: CERVICAL SPINE - COMPLETE 4+ VIEW COMPARISON:  Report  only cervical spine CT 05/27/2010. FINDINGS: There is no evidence of cervical spine fracture or prevertebral soft tissue swelling. Alignment is normal. Mild degenerative endplate osteophytes are seen throughout the cervical spine. There is some disc space narrowing at C2-C3 C4-C5 and C5-C6 compatible with degenerative change. IMPRESSION: No acute fracture or subluxation of the cervical spine. Mild degenerative changes. Electronically Signed   By: Ronney Asters M.D.   On: 05/17/2021 18:17     EKG EKG Interpretation  Date/Time:  Saturday May 17 2021 16:51:46 EST Ventricular Rate:  107 PR Interval:  166 QRS Duration: 78 QT Interval:  340 QTC Calculation: 453 R Axis:   -22 Text Interpretation: Sinus tachycardia Confirmed by Lajean Saver [phone]) on 05/17/2021 5:49:23 PM  Radiology DG Cervical Spine Complete  Result Date: 05/17/2021 CLINICAL DATA:  Fall. EXAM: CERVICAL SPINE - COMPLETE 4+ VIEW COMPARISON:  Report only cervical spine CT 05/27/2010. FINDINGS: There is no evidence of cervical spine fracture or prevertebral soft tissue swelling. Alignment is normal. Mild  degenerative endplate osteophytes are seen throughout the cervical spine. There is some disc space narrowing at C2-C3 C4-C5 and C5-C6 compatible with degenerative change. IMPRESSION: No acute fracture or subluxation of the cervical spine. Mild degenerative changes. Electronically Signed   By: Ronney Asters M.D.   On: 05/17/2021 18:17    Procedures Procedures   Medications Ordered in ED Medications  acetaminophen (TYLENOL) tablet 1,000 mg (has no administration in time range)    ED Course  I have reviewed the triage vital signs and the nursing notes.  Pertinent labs & imaging results that were available during my care of the patient were reviewed by me and considered in my medical decision making (see chart for details).    MDM Rules/Calculators/A&P                         Labs sent. Ecg. Continuous pulse ox and cardiac monitoring.   Reviewed nursing notes and prior charts for additional history.   Labs reviewed/interpreted by me - wbc and hct normal.   CT reviewed/interpreted by me - no hem.   Po fluids/food. Ambulate in hall. Acetaminophen po.   Additional labs reviewed/interpreted by me - flu and covid +.   Pt tolerating po, has eaten/drank. Ambulated in hall.   Discussed labs w pt. Pt indicates had covid vaccine x 2 and booster. Will give rx molnupiravir.    Currently breathing comfortably, no faintness, vitals normal, pulse ox 100%, and family w pt. Pt currently appears stable for d/c.   Return precautions provided.       Final Clinical Impression(s) / ED Diagnoses Final diagnoses:  None    Rx / DC Orders ED Discharge Orders     None            Lajean Saver, MD 05/17/21 2136

## 2021-05-17 NOTE — ED Provider Notes (Signed)
Emergency Medicine Provider Triage Evaluation Note  Vincent [name] , a 80 y.o. male  was evaluated in triage.  Pt complains of syncope yesterday and today.  Pt thinks he may have hit his head and his neck on the back of the toliet   Review of Systems  Positive: headache Negative: No chest pain   Physical Exam  BP 120/77 (BP Location: Right Arm)    Pulse (!) 104    Temp 98.3 F (36.8 C) (Oral)    Resp 18    Ht 5\' 3"  (1.6 m)    Wt 77.1 kg    SpO2 95%    BMI 30.11 kg/m  Gen:   Awake, no distress   Resp:  Normal effort  MSK:   Moves extremities without difficulty  Other:  Tender cervical spine   Medical Decision Making  Medically screening exam initiated at 5:07 PM.  Appropriate orders placed.  Vincent [name] was informed that the remainder of the evaluation will be completed by another provider, this initial triage assessment does not replace that evaluation, and the importance of remaining in the ED until their evaluation is complete.     Fransico Meadow, [place] 05/17/21 1709    Fredia Sorrow, MD 05/21/21 1323

## 2021-05-17 NOTE — ED Triage Notes (Addendum)
Pt arrived POV from home c/o of LOC x2 for 2 days. Pt stated he got up out of the bed last night and passed out and then again this morning when he got out of bed. Pt states he is not sure if he hit his back but his upper back and lower neck hurts. Pt is not on a blood thinner.

## 2022-04-28 ENCOUNTER — Ambulatory Visit
Admission: RE | Admit: 2022-04-28 | Discharge: 2022-04-28 | Disposition: A | Discharge status: Home / Self Care | Payer: 59 | Source: Ambulatory Visit | Attending: Family Medicine | Admitting: Family Medicine

## 2022-04-28 ENCOUNTER — Other Ambulatory Visit (HOSPITAL_COMMUNITY): Payer: Self-pay | Admitting: Family Medicine

## 2022-04-28 ENCOUNTER — Other Ambulatory Visit: Payer: Self-pay | Admitting: Family Medicine

## 2022-04-28 DIAGNOSIS — R0600 Dyspnea, unspecified: Secondary | ICD-10-CM

## 2022-04-28 DIAGNOSIS — I739 Peripheral vascular disease, unspecified: Secondary | ICD-10-CM

## 2022-05-06 ENCOUNTER — Ambulatory Visit (HOSPITAL_COMMUNITY)
Admission: RE | Admit: 2022-05-06 | Discharge: 2022-05-06 | Disposition: A | Discharge status: Home / Self Care | Payer: 59 | Source: Ambulatory Visit | Attending: Family Medicine | Admitting: Family Medicine

## 2022-05-06 DIAGNOSIS — I739 Peripheral vascular disease, unspecified: Secondary | ICD-10-CM | POA: Diagnosis present

## 2022-06-03 ENCOUNTER — Encounter: Payer: Self-pay | Admitting: Vascular Surgery

## 2022-06-03 ENCOUNTER — Ambulatory Visit (INDEPENDENT_AMBULATORY_CARE_PROVIDER_SITE_OTHER): Payer: Medicare HMO | Admitting: Vascular Surgery

## 2022-06-03 VITALS — BP 146/81 | HR 72 | Temp 97.9°F | Resp 20 | Ht 63.0 in | Wt 175.0 lb

## 2022-06-03 DIAGNOSIS — I70212 Atherosclerosis of native arteries of extremities with intermittent claudication, left leg: Secondary | ICD-10-CM | POA: Diagnosis not present

## 2022-06-03 NOTE — Progress Notes (Signed)
Patient ID: Vincent [name], male   DOB: July 11, 1940, 82 y.o.   MRN: [id number]  Reason for Consult: New Patient (Initial Visit)   Referred by Velna Ochs, MD  Subjective:     HPI:  Vincent [name] is a 82 y.o. male with known history of iliac artery stenosis.  Patient takes aspirin every day.  He states that occasionally he will have pain in the left thigh with walking but denies any tissue loss or ulceration denies any breast pain.  He walks as far as he would like without limitation.  No history of vascular intervention.  Past Medical History:  Diagnosis Date   GI bleed    H. pylori infection    Iliac artery stenosis, right (HCC)    90% by CTA 05/27/2010   Prostate cancer ([place]) 02/08/2013   Right hydrocele    S/P radiation therapy 01/30/2015 through 03/27/2015                                                      Prostate 7800 cGy, and seminal vesical/pelvic lymph nodes 5600 cGy, both in 40 sessions                           Transfusion history    Secondary to GIB 05/2010   Ulcer disease    Gastric Ulcer   Family History  Problem Relation Age of Onset   Breast cancer Mother    Cancer Mother        Breast   Cancer Brother        Liver   Cancer Brother        Liver   Arthritis Daughter    Migraines Daughter    Arthritis Daughter    Arthritis Daughter    Past Surgical History:  Procedure Laterality Date   ESOPHAGOGASTRODUODENOSCOPY N/A 07/31/2012   Procedure: ESOPHAGOGASTRODUODENOSCOPY (EGD);  Surgeon: Lafayette Dragon, MD;  Location: [facility];  Service: Endoscopy;  Laterality: N/A;   PROSTATE BIOPSY  02/08/2013   Ulcer surgery      Short Social History:  Social History   Tobacco Use   Smoking status: Former    Packs/day: 0.30    Types: Cigarettes   Smokeless tobacco: Never   Tobacco comments:    1 pack every 3 days  Substance Use Topics   Alcohol use: No    Alcohol/week: 0.0 standard drinks of alcohol    No Known Allergies  Current Outpatient  Medications  Medication Sig Dispense Refill   latanoprost (XALATAN) 0.005 % ophthalmic solution INSTILL 1 DROP IN BOTH EYES EXACTLY ONCE A DAY AT BED TIME.  4   megestrol (MEGACE) 20 MG tablet TAKE 1 TABLET BY MOUTH TWICE DAILY AS NEEDED FOR HOT FLASHES.  2   omeprazole (PRILOSEC) 40 MG capsule Take 40 mg by mouth daily.     rosuvastatin (CRESTOR) 20 MG tablet Take 20 mg by mouth daily.     tamsulosin (FLOMAX) 0.4 MG CAPS capsule Take 1 capsule (0.4 mg total) by mouth daily. 30 capsule 3   Vitamin D, Ergocalciferol, (DRISDOL) 1.25 MG (50000 UNIT) CAPS capsule Take 50,000 Units by mouth once a week.     No current facility-administered medications for this visit.    Review of Systems  Constitutional:  Constitutional negative. HENT: HENT  negative.  Eyes: Eyes negative.  Cardiovascular: Positive for claudication.  GI: Gastrointestinal negative.  Musculoskeletal: Musculoskeletal negative.  Skin: Skin negative.  Neurological: Neurological negative. Hematologic: Hematologic/lymphatic negative.  Psychiatric: Psychiatric negative.        Objective:  Objective   Vitals:   06/03/22 1039  BP: (!) 146/81  Pulse: 72  Resp: 20  Temp: 97.9 F (36.6 C)  SpO2: 98%  Weight: 175 lb (79.4 kg)  Height: '5\' 3"'$  (1.6 m)   Body mass index is 31 kg/m.  Physical Exam HENT:     Head: Normocephalic.     Nose: Nose normal.  Eyes:     Pupils: Pupils are equal, round, and reactive to light.  Cardiovascular:     Rate and Rhythm: Normal rate.     Pulses:          Femoral pulses are 1+ on the right side and 1+ on the left side. Pulmonary:     Effort: Pulmonary effort is normal.  Abdominal:     General: Abdomen is flat.     Palpations: Abdomen is soft.  Musculoskeletal:        General: Normal range of motion.     Cervical back: Normal range of motion and neck supple.     Right lower leg: No edema.     Left lower leg: No edema.  Skin:    Capillary Refill: Capillary refill takes less than 2  seconds.     Comments: Dry skin  Neurological:     General: No focal deficit present.     Mental Status: He is alert.  Psychiatric:        Mood and Affect: Mood normal.        Thought Content: Thought content normal.        Judgment: Judgment normal.     Data: ABI Findings:  +--------+------------------+-----+----------+--------+  Right  Rt Pressure (mmHg)IndexWaveform  Comment   +--------+------------------+-----+----------+--------+  [id number]                   triphasic           +--------+------------------+-----+----------+--------+  PTA    95                0.58 monophasic          +--------+------------------+-----+----------+--------+  DP     97                0.60 monophasic          +--------+------------------+-----+----------+--------+   +--------+------------------+-----+----------+-------+  Left   Lt Pressure (mmHg)IndexWaveform  Comment  +--------+------------------+-----+----------+-------+  [id number]                   triphasic          +--------+------------------+-----+----------+-------+  PTA    145               0.89 monophasic         +--------+------------------+-----+----------+-------+  DP     138               0.85 monophasic         +--------+------------------+-----+----------+-------+   +-------+-----------+-----------+------------+------------+  ABI/TBIToday's ABIToday's TBIPrevious ABIPrevious TBI  +-------+-----------+-----------+------------+------------+  Right 0.60                  0.98                      +-------+-----------+-----------+------------+------------+  Left  0.89  0.84                      +-------+-----------+-----------+------------+------------+        Summary:  Right: Resting right ankle-brachial index indicates moderate right lower  extremity arterial disease.   Left: Resting left ankle-brachial index indicates mild left lower   extremity arterial disease, however  arterial Doppler waveforms at the ankle suggest a higher level of arterial  occlusive disease.      Assessment/Plan:     82 year old male with history of right common iliac artery stenosis now with claudication of the left leg but with palpable femoral pulses bilaterally.  Plan will be to continue walking and aspirin therapy and follow-up in 1 year with repeat ABIs.    Waynetta Sandy MD [facility]

## 2022-06-10 ENCOUNTER — Encounter: Payer: Medicare HMO | Admitting: Vascular Surgery
